# Patient Record
Sex: Male | Born: 1970
Health system: Southern US, Community
[De-identification: ages and names within clinical notes are randomized; demographics above are authoritative.]

## PROBLEM LIST (undated history)

## (undated) HISTORY — PX: HERNIA REPAIR: SHX51

---

## 1998-05-19 ENCOUNTER — Emergency Department (HOSPITAL_COMMUNITY): Admission: EM | Admit: 1998-05-19 | Discharge: 1998-05-19 | Payer: Self-pay | Admitting: Emergency Medicine

## 1998-11-07 ENCOUNTER — Ambulatory Visit (HOSPITAL_COMMUNITY): Admission: RE | Admit: 1998-11-07 | Discharge: 1998-11-07 | Payer: Self-pay | Admitting: Urology

## 1999-09-09 ENCOUNTER — Emergency Department (HOSPITAL_COMMUNITY): Admission: EM | Admit: 1999-09-09 | Discharge: 1999-09-09 | Payer: Self-pay | Admitting: Emergency Medicine

## 2004-02-20 ENCOUNTER — Emergency Department (HOSPITAL_COMMUNITY): Admission: EM | Admit: 2004-02-20 | Discharge: 2004-02-20 | Payer: Self-pay | Admitting: Emergency Medicine

## 2004-05-24 ENCOUNTER — Observation Stay (HOSPITAL_COMMUNITY): Admission: EM | Admit: 2004-05-24 | Discharge: 2004-05-25 | Payer: Self-pay | Admitting: *Deleted

## 2004-07-28 ENCOUNTER — Ambulatory Visit (HOSPITAL_BASED_OUTPATIENT_CLINIC_OR_DEPARTMENT_OTHER): Admission: RE | Admit: 2004-07-28 | Discharge: 2004-07-28 | Payer: Self-pay | Admitting: General Surgery

## 2004-07-28 ENCOUNTER — Ambulatory Visit (HOSPITAL_COMMUNITY): Admission: RE | Admit: 2004-07-28 | Discharge: 2004-07-28 | Payer: Self-pay | Admitting: General Surgery

## 2004-10-02 ENCOUNTER — Ambulatory Visit (HOSPITAL_COMMUNITY): Admission: RE | Admit: 2004-10-02 | Discharge: 2004-10-02 | Payer: Self-pay | Admitting: Family Medicine

## 2005-12-20 IMAGING — CT CT HEAD W/O CM
1 of 2 series · 13 of 30 positions shown, 17 images · non-contrast
Comparison: none

CLINICAL DATA: Altered mental status. Slurred speech.
 CT BRAIN WITHOUT CONTRAST
 Axial scans from the base to the vertex were performed in the unenhanced state.  The ventricular system is normal in size and configuration and the septum is in a normal midline position.  The fourth ventricle and basilar cisterns appear normal.  No acute intracranial abnormality is seen.  No mass effect is noted.  On bone window images, no bony abnormality is seen.  
 IMPRESSION 
 No acute intracranial abnormality.

[Series 2: brain · axial · 0.47mm/px · z∈[+136,+266]mm · 13 of 32 slices shown, 17 images]
[im 3/32  brain]
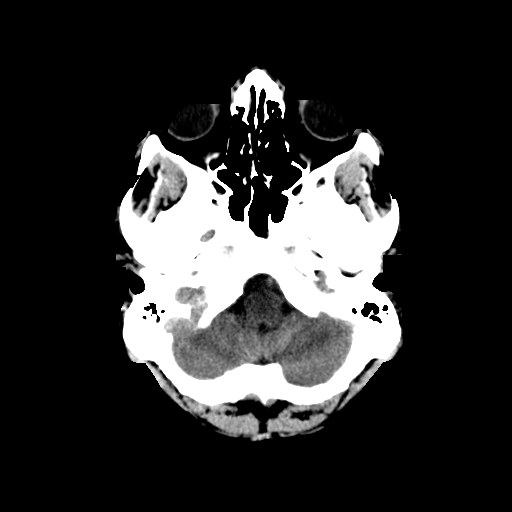
[im 3/32  bone]
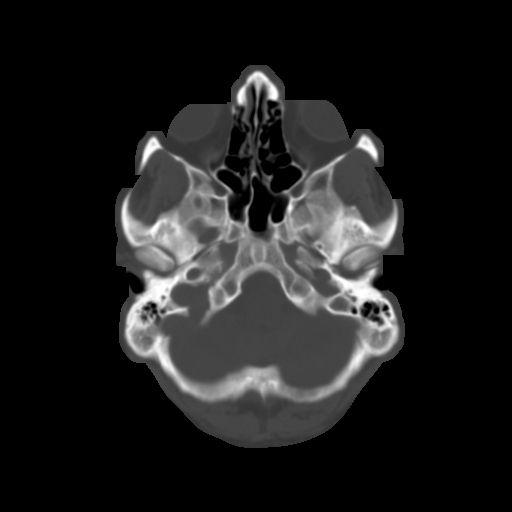
[im 5/32  brain]
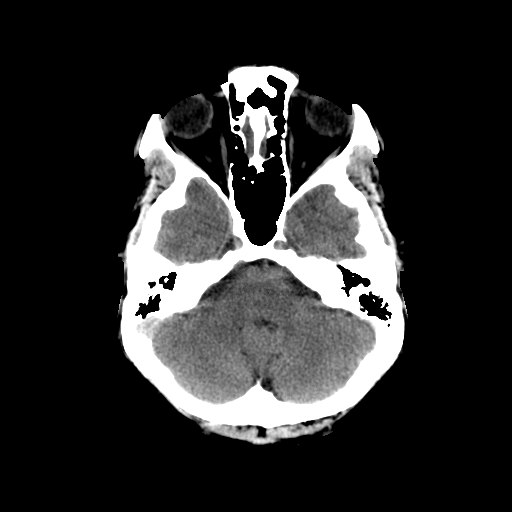
[im 7/32  brain]
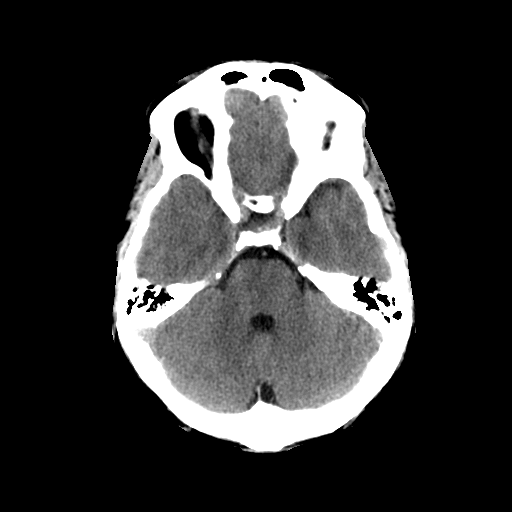
[im 9/32  brain]
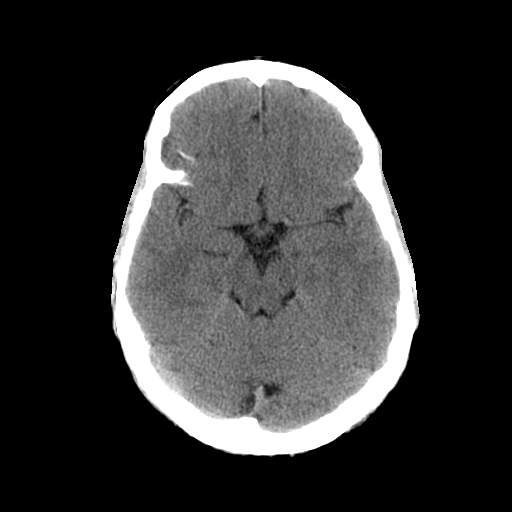
[im 12/32  brain]
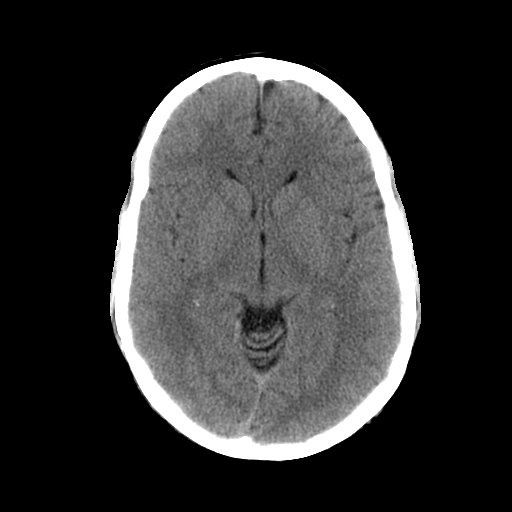
[im 12/32  bone]
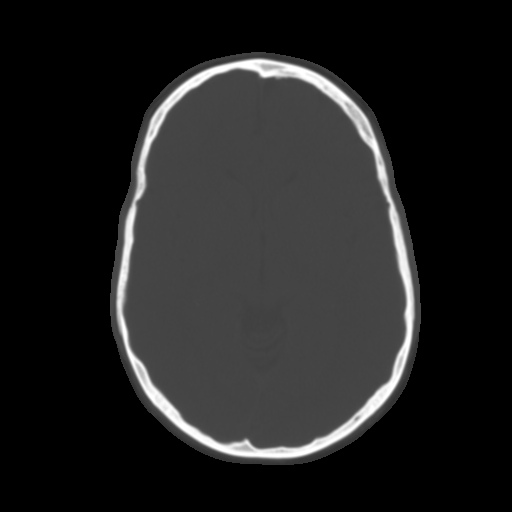
[im 14/32  brain]
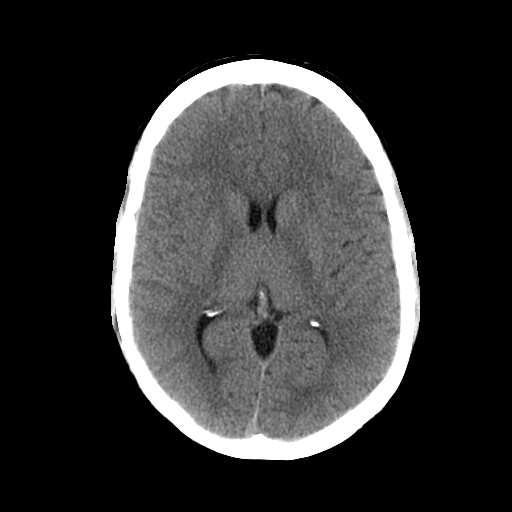
[im 16/32  brain]
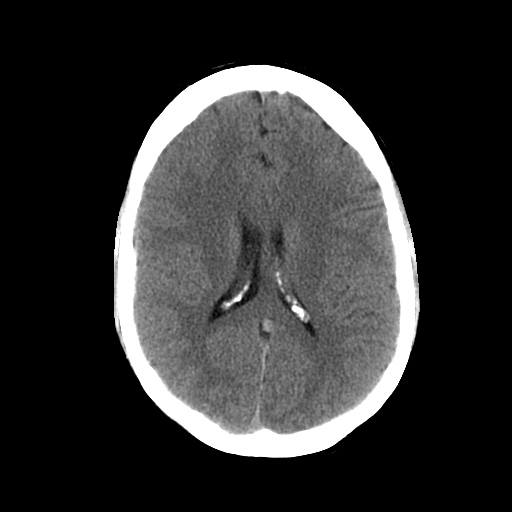
[im 18/32  brain]
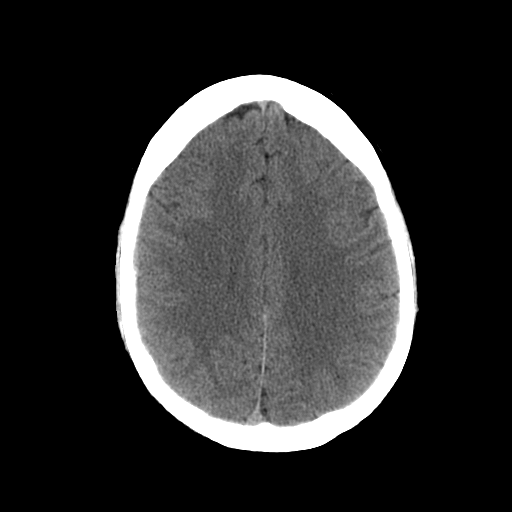
[im 20/32  brain]
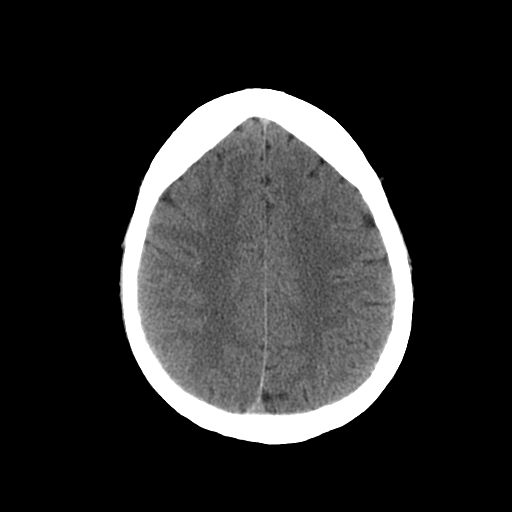
[im 20/32  bone]
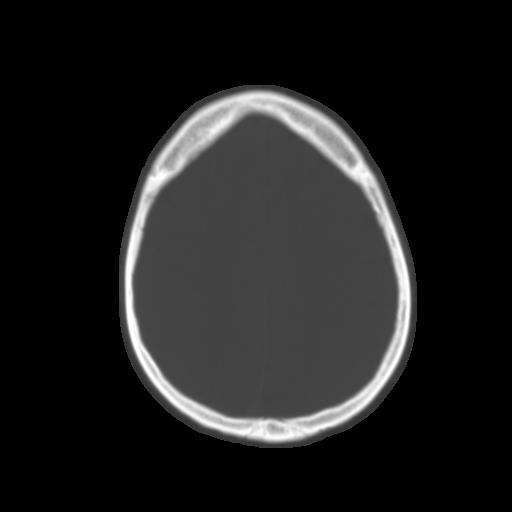
[im 23/32  brain]
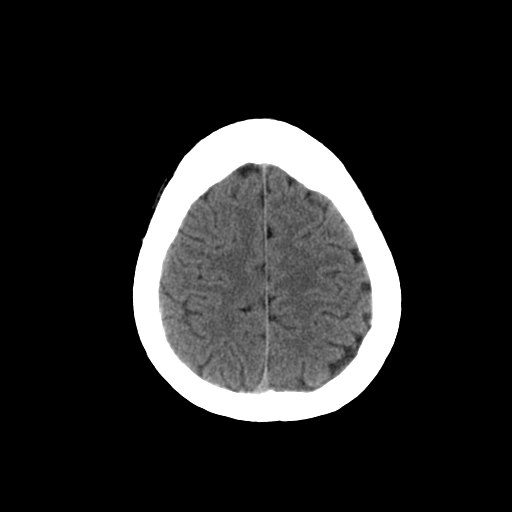
[im 25/32  brain]
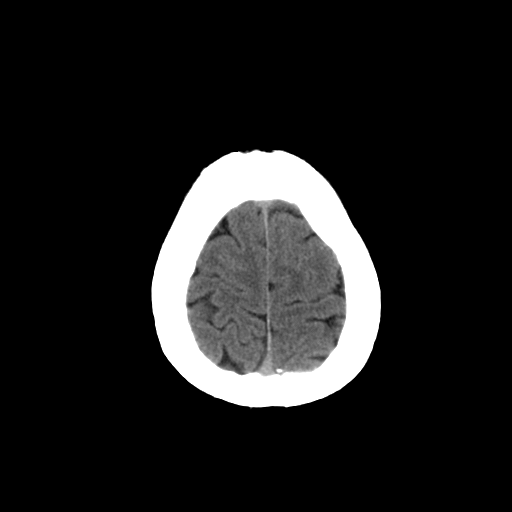
[im 27/32  brain]
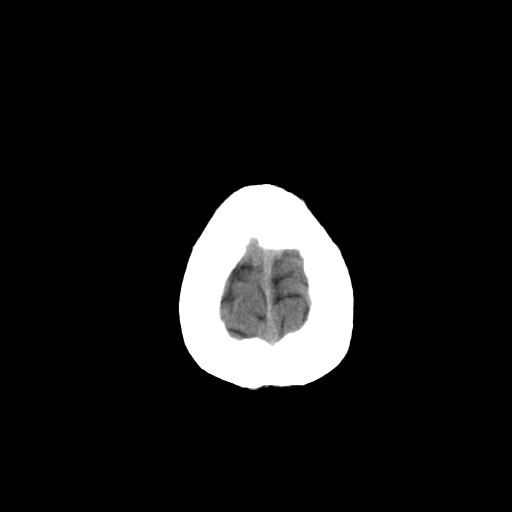
[im 29/32  brain]
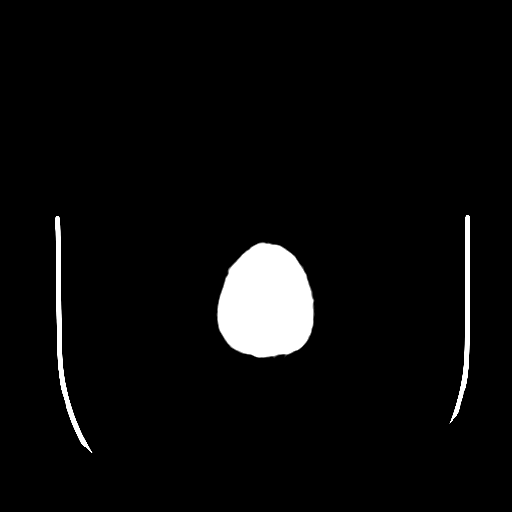
[im 29/32  bone]
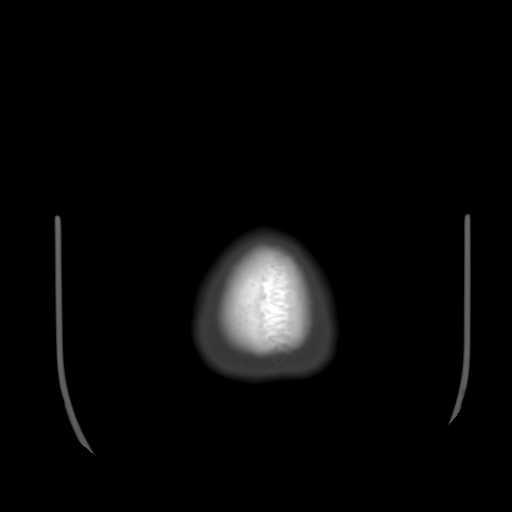

[13 of 30 positions shown; findings below may reference images not displayed]

## 2006-04-30 IMAGING — CR DG CHEST 2V
2 series · 2 of 2 positions shown · non-contrast
Comparison: none

CLINICAL DATA: Chest tightness.  Dyspnea.  Occasional smoker.  
 CHEST - 2 VIEWS: 
 PA and lateral views of the chest are made and are compared to previous studies of 05/24/04.  There is mild diffuse peribronchial thickening, which appears to be stable.  The heart, mediastinum, peripheral lungs, bony thorax and soft tissues are normal.

[view not recorded (1 of 2)]
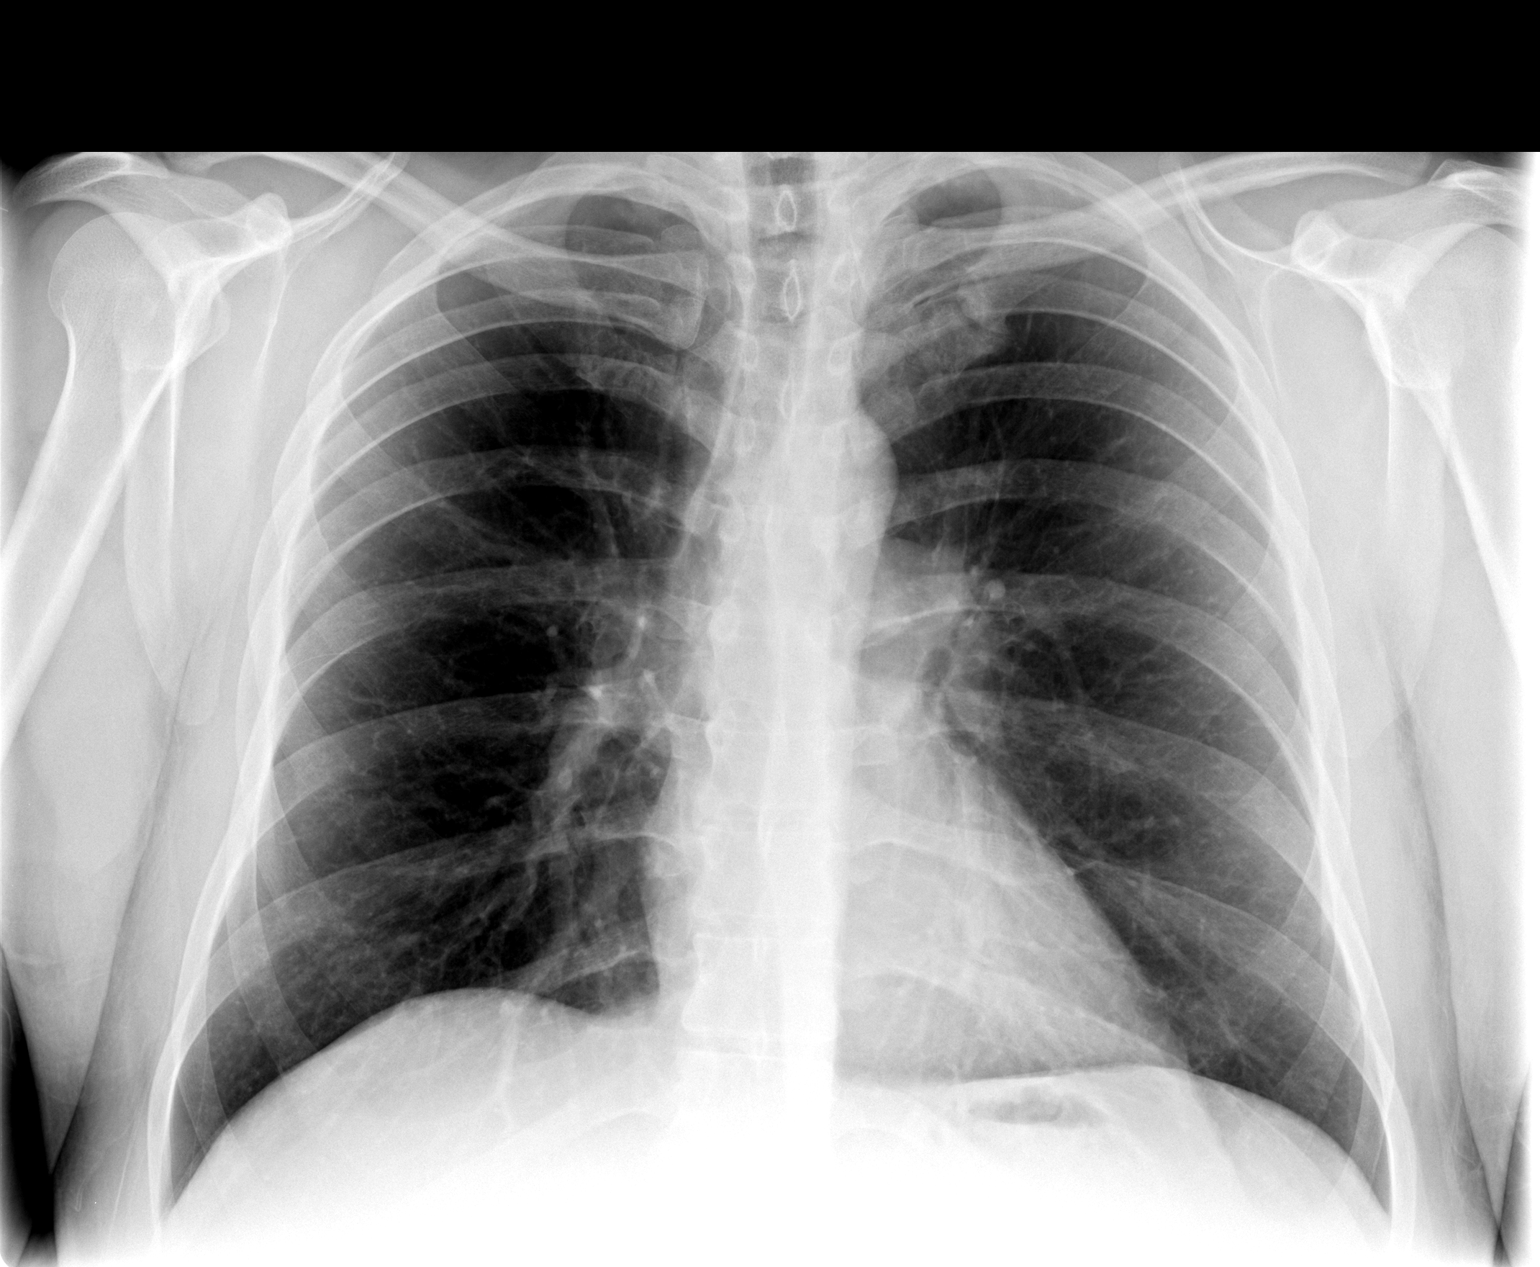

[view not recorded (2 of 2)]
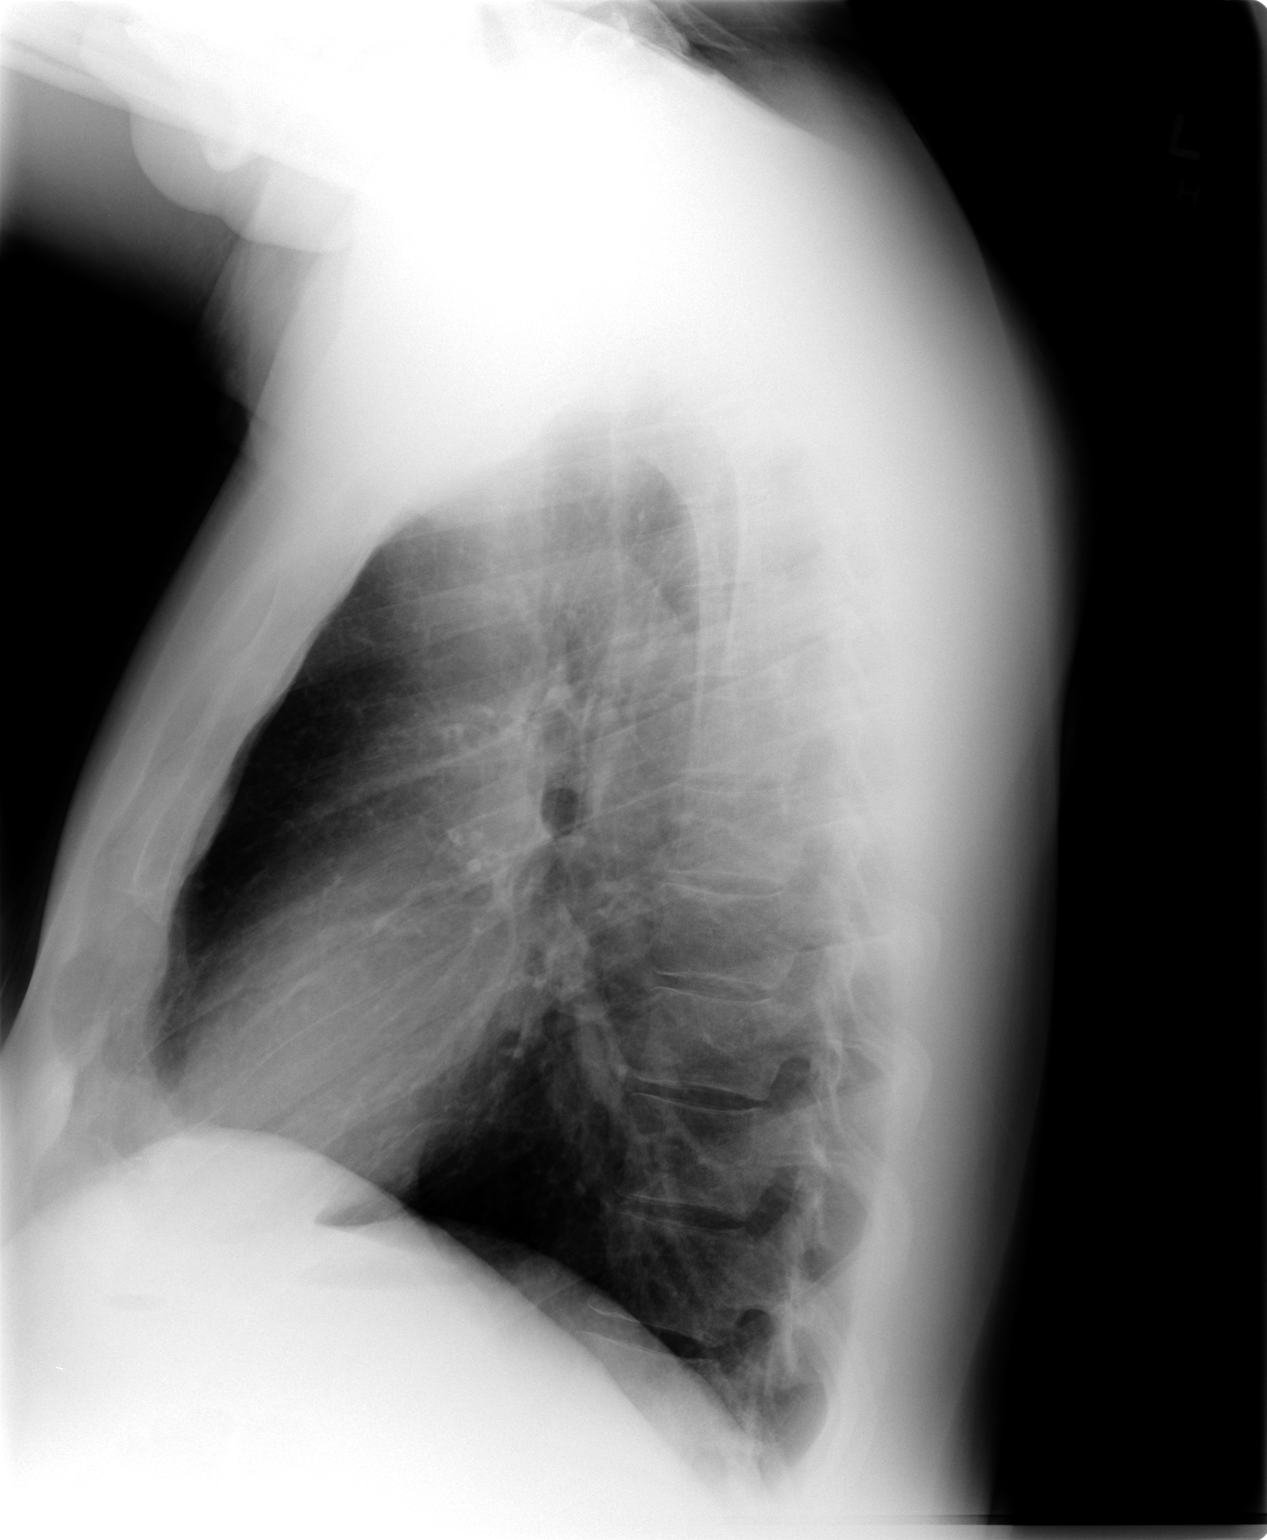

[2 of 2 positions shown; findings below may reference images not displayed]

IMPRESSION: Mild diffuse peribronchial thickening.  No interval change or active disease.

## 2013-11-12 ENCOUNTER — Emergency Department (INDEPENDENT_AMBULATORY_CARE_PROVIDER_SITE_OTHER)
Admission: EM | Admit: 2013-11-12 | Discharge: 2013-11-12 | Disposition: A | Discharge status: Home / Self Care | Payer: No Typology Code available for payment source | Source: Home / Self Care | Attending: Family Medicine | Admitting: Family Medicine

## 2013-11-12 ENCOUNTER — Encounter (HOSPITAL_COMMUNITY): Payer: Self-pay | Admitting: Emergency Medicine

## 2013-11-12 DIAGNOSIS — Y92410 Unspecified street and highway as the place of occurrence of the external cause: Secondary | ICD-10-CM

## 2013-11-12 DIAGNOSIS — M549 Dorsalgia, unspecified: Secondary | ICD-10-CM
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
## 2013-11-13 NOTE — ED Provider Notes (Signed)
Medical screening examination/treatment/procedure(s) were performed by resident physician or non-physician practitioner and as supervising physician I was immediately available for consultation/collaboration.   Keighan Amezcua DOUGLAS MD.   Granvel Proudfoot D Eavan Gonterman, MD 11/13/13 1117 

## 2014-01-25 ENCOUNTER — Emergency Department (INDEPENDENT_AMBULATORY_CARE_PROVIDER_SITE_OTHER)
Admission: EM | Admit: 2014-01-25 | Discharge: 2014-01-25 | Disposition: A | Discharge status: Home / Self Care | Payer: No Typology Code available for payment source | Source: Home / Self Care | Attending: Family Medicine | Admitting: Family Medicine

## 2014-01-25 ENCOUNTER — Encounter (HOSPITAL_COMMUNITY): Payer: Self-pay | Admitting: Emergency Medicine

## 2014-01-25 DIAGNOSIS — M545 Low back pain, unspecified: Secondary | ICD-10-CM

## 2014-01-25 MED ORDER — DICLOFENAC SODIUM 75 MG PO TBEC: 75.0000 mg | DELAYED_RELEASE_TABLET | Freq: Two times a day (BID) | ORAL | Status: DC | PRN

## 2014-01-25 MED ORDER — HYDROCODONE-ACETAMINOPHEN 5-325 MG PO TABS: 1.0000 | ORAL_TABLET | Freq: Four times a day (QID) | ORAL | Status: DC | PRN

## 2014-01-25 MED ORDER — CYCLOBENZAPRINE HCL 10 MG PO TABS: 10.0000 mg | ORAL_TABLET | Freq: Every evening | ORAL | Status: DC | PRN

## 2014-01-25 NOTE — ED Notes (Signed)
Patient c/o back spasms since yesterday morning. Patient states when he raises his legs the pain is more intense.  Patient has tired Max Freeze and naproxen with some relief.

## 2014-01-25 NOTE — Discharge Instructions (Signed)
Thank you for coming in today. °Come back or go to the emergency room if you notice new weakness new numbness problems walking or bowel or bladder problems. ° ° °Back Exercises °These exercises may help you when beginning to rehabilitate your injury. Your symptoms may resolve with or without further involvement from your physician, physical therapist or athletic trainer. While completing these exercises, remember:  °· Restoring tissue flexibility helps normal motion to return to the joints. This allows healthier, less painful movement and activity. °· An effective stretch should be held for at least 30 seconds. °· A stretch should never be painful. You should only feel a gentle lengthening or release in the stretched tissue. °STRETCH - Extension, Prone on Elbows  °· Lie on your stomach on the floor, a bed will be too soft. Place your palms about shoulder width apart and at the height of your head. °· Place your elbows under your shoulders. If this is too painful, stack pillows under your chest. °· Allow your body to relax so that your hips drop lower and make contact more completely with the floor. °· Hold this position for __________ seconds. °· Slowly return to lying flat on the floor. °Repeat __________ times. Complete this exercise __________ times per day.  °RANGE OF MOTION - Extension, Prone Press Ups  °· Lie on your stomach on the floor, a bed will be too soft. Place your palms about shoulder width apart and at the height of your head. °· Keeping your back as relaxed as possible, slowly straighten your elbows while keeping your hips on the floor. You may adjust the placement of your hands to maximize your comfort. As you gain motion, your hands will come more underneath your shoulders. °· Hold this position __________ seconds. °· Slowly return to lying flat on the floor. °Repeat __________ times. Complete this exercise __________ times per day.  °RANGE OF MOTION- Quadruped, Neutral Spine  °· Assume a hands  and knees position on a firm surface. Keep your hands under your shoulders and your knees under your hips. You may place padding under your knees for comfort. °· Drop your head and point your tail bone toward the ground below you. This will round out your low back like an angry cat. Hold this position for __________ seconds. °· Slowly lift your head and release your tail bone so that your back sags into a large arch, like an old horse. °· Hold this position for __________ seconds. °· Repeat this until you feel limber in your low back. °· Now, find your "sweet spot." This will be the most comfortable position somewhere between the two previous positions. This is your neutral spine. Once you have found this position, tense your stomach muscles to support your low back. °· Hold this position for __________ seconds. °Repeat __________ times. Complete this exercise __________ times per day.  °STRETCH - Flexion, Single Knee to Chest  °· Lie on a firm bed or floor with both legs extended in front of you. °· Keeping one leg in contact with the floor, bring your opposite knee to your chest. Hold your leg in place by either grabbing behind your thigh or at your knee. °· Pull until you feel a gentle stretch in your low back. Hold __________ seconds. °· Slowly release your grasp and repeat the exercise with the opposite side. °Repeat __________ times. Complete this exercise __________ times per day.  °STRETCH - Hamstrings, Standing °· Stand or sit and extend your right / left   leg, placing your foot on a chair or foot stool °· Keeping a slight arch in your low back and your hips straight forward. °· Lead with your chest and lean forward at the waist until you feel a gentle stretch in the back of your right / left knee or thigh. (When done correctly, this exercise requires leaning only a small distance.) °· Hold this position for __________ seconds. °Repeat __________ times. Complete this stretch __________ times per  day. °STRENGTHENING - Deep Abdominals, Pelvic Tilt  °· Lie on a firm bed or floor. Keeping your legs in front of you, bend your knees so they are both pointed toward the ceiling and your feet are flat on the floor. °· Tense your lower abdominal muscles to press your low back into the floor. This motion will rotate your pelvis so that your tail bone is scooping upwards rather than pointing at your feet or into the floor. °· With a gentle tension and even breathing, hold this position for __________ seconds. °Repeat __________ times. Complete this exercise __________ times per day.  °STRENGTHENING - Abdominals, Crunches  °· Lie on a firm bed or floor. Keeping your legs in front of you, bend your knees so they are both pointed toward the ceiling and your feet are flat on the floor. Cross your arms over your chest. °· Slightly tip your chin down without bending your neck. °· Tense your abdominals and slowly lift your trunk high enough to just clear your shoulder blades. Lifting higher can put excessive stress on the low back and does not further strengthen your abdominal muscles. °· Control your return to the starting position. °Repeat __________ times. Complete this exercise __________ times per day.  °STRENGTHENING - Quadruped, Opposite UE/LE Lift  °· Assume a hands and knees position on a firm surface. Keep your hands under your shoulders and your knees under your hips. You may place padding under your knees for comfort. °· Find your neutral spine and gently tense your abdominal muscles so that you can maintain this position. Your shoulders and hips should form a rectangle that is parallel with the floor and is not twisted. °· Keeping your trunk steady, lift your right hand no higher than your shoulder and then your left leg no higher than your hip. Make sure you are not holding your breath. Hold this position __________ seconds. °· Continuing to keep your abdominal muscles tense and your back steady, slowly return  to your starting position. Repeat with the opposite arm and leg. °Repeat __________ times. Complete this exercise __________ times per day. °Document Released: 08/06/2005 Document Revised: 10/11/2011 Document Reviewed: 10/31/2008 °ExitCare® Patient Information ©2015 [employer]. This information is not intended to replace advice given to you by your health care provider. Make sure you discuss any questions you have with your health care provider. ° °

## 2014-01-25 NOTE — ED Provider Notes (Signed)
Scott HoveChristopher Peoples Sr. is a 43 y.o. male who presents to Urgent Care today for low back pain. Patient has acute onset of bilateral low back pain starting yesterday morning. He denies any injury. The pain occurred when he stood up from a bent forward position. He denies any radiating pain weakness or numbness. He denies any fevers chills nausea vomiting or diarrhea. No bowel bladder dysfunction. He tried an old leftover prescription of Norco which helped his pain some.   History reviewed. No pertinent past medical history. History  Substance Use Topics  . Smoking status: Current Every Day Smoker -- 0.50 packs/day    Types: Cigarettes  . Smokeless tobacco: Not on file  . Alcohol Use: Yes   ROS as above Medications: No current facility-administered medications for this encounter.   Current Outpatient Prescriptions  Medication Sig Dispense Refill  . ALLOPURINOL PO Take by mouth.      . Fish Oil-Cholecalciferol (FISH OIL + D3 PO) Take by mouth.      Scott Beard Kitchen. gemfibrozil (LOPID) 600 MG tablet Take 600 mg by mouth 2 (two) times daily before a meal.      . magnesium 30 MG tablet Take 30 mg by mouth 2 (two) times daily.      . Multiple Vitamins-Minerals (MULTIVITAMIN WITH MINERALS) tablet Take 1 tablet by mouth daily.      . potassium chloride (KLOR-CON) 20 MEQ packet Take 20 mEq by mouth 2 (two) times daily.      . cyclobenzaprine (FLEXERIL) 10 MG tablet Take 1 tablet (10 mg total) by mouth at bedtime as needed for muscle spasms.  20 tablet  0  . diclofenac (VOLTAREN) 75 MG EC tablet Take 1 tablet (75 mg total) by mouth 2 (two) times daily as needed.  60 tablet  0  . HYDROcodone-acetaminophen (NORCO/VICODIN) 5-325 MG per tablet Take 1 tablet by mouth every 6 (six) hours as needed.  15 tablet  0    Exam:  BP 123/71  Pulse 66  Temp(Src) 97.8 F (36.6 C) (Oral)  Resp 12  SpO2 98% Gen: Well NAD HEENT: ,  MMM Lungs: Normal work of breathing. CTABL Heart: RRR no MRG Exts: Brisk capillary refill,  warm and well perfused.  Back: Nontender to spinal midline. Tender palpation bilateral SI joint. Lumbar range of motion is significantly limited by pain. Lower extremity strength is intact as is reflexes and sensation. Equal bilaterally.  No results found for this or any previous visit (from the past 24 hour(s)). No results found.  Assessment and Plan: 43 y.o. male with lumbago due to myofascial disruption. Plan to treat with diclofenac Flexeril and Norco. Home exercise program. Followup with orthopedics as needed.  Discussed warning signs or symptoms. Please see discharge instructions. Patient expresses understanding.    Rodolph BongEvan S Myya Meenach, MD 01/25/14 1740

## 2014-12-19 ENCOUNTER — Other Ambulatory Visit: Payer: Self-pay | Admitting: *Deleted

## 2014-12-19 DIAGNOSIS — R202 Paresthesia of skin: Secondary | ICD-10-CM

## 2015-01-07 ENCOUNTER — Encounter: Payer: No Typology Code available for payment source | Admitting: Neurology

## 2015-01-08 ENCOUNTER — Encounter: Payer: Self-pay | Admitting: Neurology

## 2015-08-19 ENCOUNTER — Ambulatory Visit (INDEPENDENT_AMBULATORY_CARE_PROVIDER_SITE_OTHER): Payer: 59 | Admitting: Neurology

## 2015-08-19 DIAGNOSIS — R202 Paresthesia of skin: Secondary | ICD-10-CM

## 2015-08-19 DIAGNOSIS — G5601 Carpal tunnel syndrome, right upper limb: Secondary | ICD-10-CM

## 2015-08-19 NOTE — Procedures (Signed)
[facility]  [address] [place], [address]  [place], [place] [address] Tel: [phone] Fax:  [phone] Test Date:  08/19/2015  Patient: Scott Beard, Scott Beard DOB: 1971/03/15 Physician: Nita Sickle, DO  Sex: Male Height:  Ref Phys: Tally Joe  ID#: [id number] Temp: 36.3C Technician:    Patient Complaints: This is a 45 year-old gentleman referred for right hand paresthesias.  NCV & EMG Findings: Extensive electrodiagnostic testing of the right upper extremity shows: 1. Right median sensory response shows prolonged distal peak latency (5.0 ms) and reduced amplitude (10.4 V).  Right ulnar sensory response is within normal limits. 2. Right median motor nerve showed prolonged distal onset latency (4.5 ms).  The right ulnar motor nerve showed decreased conduction velocity (A Elbow-B Elbow, 48 m/s).   3. There is no evidence of active or chronic motor axon loss changes affecting any of the tested muscles. Motor unit recruitment pattern and configuration is within normal limits.  Impression: 1. Right median neuropathy at or distal to the wrist, consistent with the clinical diagnosis of carpal tunnel syndrome. Overall, these findings are moderate in degree electrically. 2. Very mild right ulnar neuropathy with slowing across the elbow, purely demyelinating in type. 3. There is no evidence of a cervical radiculopathy.   ___________________________ Nita Sickle, DO    Nerve Conduction Studies Anti Sensory Summary Table   Site NR Peak (ms) Norm Peak (ms) P-T Amp (V) Norm P-T Amp  Right Median Anti Sensory (2nd Digit)  Wrist    5.0 <3.4 10.4 >20  Right Ulnar Anti Sensory (5th Digit)  Wrist    2.6 <3.1 22.3 >12   Motor Summary Table   Site NR Onset (ms) Norm Onset (ms) O-P Amp (mV) Norm O-P Amp Site1 Site2 Delta-0 (ms) Dist (cm) Vel (m/s) Norm Vel (m/s)  Right Median Motor (Abd Poll Brev)  Wrist    4.5 <3.9 7.0 >6 Elbow Wrist 5.5 31.0 56 >50  Elbow    10.0  6.9          Right Ulnar Motor (Abd Dig Minimi)  Wrist    1.9 <3.1 9.3 >7 B Elbow Wrist 3.8 23.0 61 >50  B Elbow    5.7  8.0  A Elbow B Elbow 2.1 10.0 48 >50  A Elbow    7.8  7.4          EMG   Side Muscle Ins Act Fibs Psw Fasc Number Recrt Dur Dur. Amp Amp. Poly Poly. Comment  Right 1stDorInt Nml Nml Nml Nml Nml Nml Nml Nml Nml Nml Nml Nml N/A  Right Abd Poll Brev Nml Nml Nml Nml Nml Nml Nml Nml Nml Nml Nml Nml N/A  Right Ext Indicis Nml Nml Nml Nml Nml Nml Nml Nml Nml Nml Nml Nml N/A  Right PronatorTeres Nml Nml Nml Nml Nml Nml Nml Nml Nml Nml Nml Nml N/A  Right Biceps Nml Nml Nml Nml Nml Nml Nml Nml Nml Nml Nml Nml N/A  Right Triceps Nml Nml Nml Nml Nml Nml Nml Nml Nml Nml Nml Nml N/A  Right FlexorDigProf 4&5 Nml Nml Nml Nml Nml Nml Nml Nml Nml Nml Nml Nml N/A     Waveforms:

## 2017-09-11 ENCOUNTER — Encounter (HOSPITAL_COMMUNITY): Payer: Self-pay | Admitting: *Deleted

## 2017-09-11 ENCOUNTER — Ambulatory Visit (HOSPITAL_COMMUNITY)
Admission: EM | Admit: 2017-09-11 | Discharge: 2017-09-11 | Disposition: A | Discharge status: Home / Self Care | Payer: Managed Care, Other (non HMO) | Attending: Physician Assistant | Admitting: Physician Assistant

## 2017-09-11 ENCOUNTER — Other Ambulatory Visit: Payer: Self-pay

## 2017-09-11 DIAGNOSIS — M545 Low back pain: Secondary | ICD-10-CM | POA: Diagnosis not present

## 2017-09-11 MED ORDER — CYCLOBENZAPRINE HCL 10 MG PO TABS: 10.0000 mg | ORAL_TABLET | Freq: Every evening | ORAL | 0 refills | Status: DC | PRN

## 2017-09-11 MED ORDER — CYCLOBENZAPRINE HCL 10 MG PO TABS: 10.0000 mg | ORAL_TABLET | Freq: Three times a day (TID) | ORAL | 0 refills | Status: DC | PRN

## 2017-09-11 MED ORDER — MELOXICAM 15 MG PO TABS: 15.0000 mg | ORAL_TABLET | Freq: Every day | ORAL | 0 refills | Status: AC

## 2017-09-11 MED ORDER — KETOROLAC TROMETHAMINE 60 MG/2ML IM SOLN
INTRAMUSCULAR | Status: AC
  Filled 2017-09-11: qty 2

## 2017-09-11 MED ORDER — KETOROLAC TROMETHAMINE 60 MG/2ML IM SOLN
60.0000 mg | Freq: Once | INTRAMUSCULAR | Status: AC
  Administered 2017-09-11: 60 mg via INTRAMUSCULAR

## 2017-09-11 NOTE — Discharge Instructions (Signed)
Please take Tylenol 1000 mg every 8 hours for back pain along with the Flexeril and the meloxicam.  If your not getting better in 3 weeks please come back for imaging.  Do not take any other NSAIDs i.e. ibuprofen Aleve aspirin along with this regimen.

## 2017-09-11 NOTE — ED Provider Notes (Signed)
09/11/2017 4:33 PM   DOB: Mar 18, 1971 / MRN: [id number]  SUBJECTIVE:  Scott Hovehristopher Zucker Sr. is a 47 y.o. male presenting for low back pain that started yesterday.  States the pain is excruciating and worse with movement.  Denies dysuria, hematuria, constipation, incontinence.  No trauma.  Denies weakness and numbness in the lower extremities. He has No Known Allergies.   He  has no past medical history on file.    He  reports that he has been smoking cigarettes.  He has been smoking about 0.50 packs per day. he has never used smokeless tobacco. He reports that he drinks alcohol. He reports that he does not use drugs. He  reports that he currently engages in sexual activity. The patient  has a past surgical history that includes Hernia repair.  His family history is not on file.  Review of Systems  Constitutional: Negative for chills, diaphoresis and fever.  Respiratory: Negative for shortness of breath.   Cardiovascular: Negative for chest pain, orthopnea and leg swelling.  Gastrointestinal: Negative for nausea.  Musculoskeletal: Positive for back pain and myalgias. Negative for falls, joint pain and neck pain.  Skin: Negative for rash.  Neurological: Negative for dizziness.    OBJECTIVE:  BP 119/63 (BP Location: Left Arm)   Pulse 80   Temp 98.3 F (36.8 C) (Oral)   SpO2 98%   Physical Exam  Constitutional: He appears well-developed. He is active and cooperative.  Non-toxic appearance.  Cardiovascular: Normal rate.  Pulmonary/Chest: Effort normal. No tachypnea.  Neurological: He is alert. He has normal strength and normal reflexes. He displays no atrophy. No cranial nerve deficit or sensory deficit. He exhibits normal muscle tone. Gait ( Gait antalgic) abnormal. Coordination normal. GCS eye subscore is 4. GCS verbal subscore is 5. GCS motor subscore is 6.  Skin: Skin is warm and dry. He is not diaphoretic. No pallor.  Vitals reviewed.   No results found for this or any previous  visit (from the past 72 hour(s)).  No results found.  ASSESSMENT AND PLAN:  No orders of the defined types were placed in this encounter.    Acute low back pain, unspecified back pain laterality, with sciatica presence unspecified: Exam reassuring.  He appears to be quite a bit of pain.  I have given him a shot of Toradol here along with meloxicam, Flexeril and am advising Tylenol 1000 mg scheduled every 8 hours.  He will come back in 2-3 weeks if he is not getting better for imaging.      The patient is advised to call or return to clinic if he does not see an improvement in symptoms, or to seek the care of the closest emergency department if he worsens with the above plan.   Deliah BostonMichael Beard, MHS, Beard 09/11/2017 4:33 PM    [name], Scott Beard 09/11/17 [phone]

## 2017-09-11 NOTE — ED Triage Notes (Addendum)
Back pain, per pt he dont know what happened

## 2017-10-08 ENCOUNTER — Other Ambulatory Visit: Payer: Self-pay | Admitting: Physician Assistant

## 2018-09-15 ENCOUNTER — Other Ambulatory Visit: Payer: Self-pay | Admitting: Family Medicine

## 2018-09-15 ENCOUNTER — Ambulatory Visit
Admission: RE | Admit: 2018-09-15 | Discharge: 2018-09-15 | Disposition: A | Discharge status: Home / Self Care | Payer: Managed Care, Other (non HMO) | Source: Ambulatory Visit | Attending: Family Medicine | Admitting: Family Medicine

## 2018-09-15 DIAGNOSIS — M25551 Pain in right hip: Secondary | ICD-10-CM

## 2020-04-10 ENCOUNTER — Ambulatory Visit (HOSPITAL_COMMUNITY)
Admission: EM | Admit: 2020-04-10 | Discharge: 2020-04-10 | Disposition: A | Discharge status: Home / Self Care | Payer: BLUE CROSS/BLUE SHIELD | Attending: Family Medicine | Admitting: Family Medicine

## 2020-04-10 ENCOUNTER — Other Ambulatory Visit: Payer: Self-pay

## 2020-04-10 ENCOUNTER — Encounter (HOSPITAL_COMMUNITY): Payer: Self-pay | Admitting: Emergency Medicine

## 2020-04-10 DIAGNOSIS — B349 Viral infection, unspecified: Secondary | ICD-10-CM | POA: Insufficient documentation

## 2020-04-10 DIAGNOSIS — Z20822 Contact with and (suspected) exposure to covid-19: Secondary | ICD-10-CM | POA: Insufficient documentation

## 2020-04-10 DIAGNOSIS — R509 Fever, unspecified: Secondary | ICD-10-CM | POA: Diagnosis present

## 2020-04-10 NOTE — Discharge Instructions (Signed)
Recommendation management of viral illness include: Vitamin D 5,000 IU daily Vitamin C 500 mg twice daily Zinc 50 mg daily

## 2020-04-10 NOTE — ED Provider Notes (Signed)
[facility]    CSN: [id number] Arrival date & time: 04/10/20  1806      History   Chief Complaint Chief Complaint  Patient presents with  . Cough  . Fever    HPI Yoshiaki Kreuser Sr. is a 49 y.o. male.   HPI  Encounter for COVID-19 Testing in Symptomatic Patient Recent Exposure to persons positive for COVID-19: Yes , close contact within the last 7 days with a person confirmed to have COVID-19  Experienced Fever:Yes, x 2 days TMA 102.1  Current Symptoms: Symptoms Body aches, fatigue and congestions, and headache Vaccination status: unvaccinated    History reviewed. No pertinent past medical history.  There are no problems to display for this patient.   Past Surgical History:  Procedure Laterality Date  . HERNIA REPAIR         Home Medications    Prior to Admission medications   Medication Sig Start Date End Date Taking? Authorizing Provider  ALLOPURINOL PO Take by mouth.   Yes [provider]  calcium-vitamin D (OSCAL WITH D) 500-200 MG-UNIT tablet Take 1 tablet by mouth.   Yes [provider]  gemfibrozil (LOPID) 600 MG tablet Take 600 mg by mouth 2 (two) times daily before a meal.   Yes [provider]  Multiple Vitamins-Minerals (MULTIVITAMIN WITH MINERALS) tablet Take 1 tablet by mouth daily.   Yes [provider]  cyclobenzaprine (FLEXERIL) 10 MG tablet Take 1 tablet (10 mg total) by mouth at bedtime as needed for muscle spasms. 09/11/17   Ofilia Neas, PA-C  cyclobenzaprine (FLEXERIL) 10 MG tablet Take 1 tablet (10 mg total) by mouth 3 (three) times daily as needed for muscle spasms. 09/11/17   Ofilia Neas, PA-C  Fish Oil-Cholecalciferol (FISH OIL + D3 PO) Take by mouth.    [provider]  magnesium 30 MG tablet Take 30 mg by mouth 2 (two) times daily.    [provider]  potassium chloride (KLOR-CON) 20 MEQ packet Take 20 mEq by mouth 2 (two) times daily.    [provider]      Family History Family History  Family history unknown: Yes    Social History Social History   Tobacco Use  . Smoking status: Current Every Day Smoker    Packs/day: 0.50    Types: Cigarettes  . Smokeless tobacco: Never Used  Vaping Use  . Vaping Use: Never used  Substance Use Topics  . Alcohol use: Yes  . Drug use: No     Allergies   Patient has no known allergies.   Review of Systems Review of Systems Pertinent negatives listed in HPI Physical Exam Triage Vital Signs ED Triage Vitals  Enc Vitals Group     BP 04/10/20 2024 128/83     Pulse Rate 04/10/20 2024 (Abnormal) 115     Resp 04/10/20 2024 16     Temp 04/10/20 2024 (Abnormal) 102.1 F (38.9 C)     Temp Source 04/10/20 2024 Oral     SpO2 04/10/20 2024 98 %     Weight --      Height --      Head Circumference --      Peak Flow --      Pain Score 04/10/20 2020 5     Pain Loc --      Pain Edu? --      Excl. in GC? --    No data found.  Updated Vital Signs Blood Pressure 128/83 (BP  Location: Right Arm)   Pulse (Abnormal) 115   Temperature (Abnormal) 102.1 F (38.9 C) (Oral)   Respiration 16   Oxygen Saturation 98%   Visual Acuity Right Eye Distance:   Left Eye Distance:   Bilateral Distance:    Right Eye Near:   Left Eye Near:    Bilateral Near:     Physical Exam General appearance: alert, ill-appearing, cooperative Respiratory: Respirations even and unlabored, normal respiratory rate, no crackles or rales. Heart: Increased HR and rhythm normal. No gallop or murmurs noted on exam  Abdomen: BS +, no distention, no rebound tenderness, or no mass Extremities: No gross deformities Skin: Skin color, texture, turgor normal. No rashes seen  Psych: Appropriate mood and affect. Neurologic: Mental status: Alert, oriented to person, place, and time, thought content appropriate.  UC Treatments / Results  Labs (all labs ordered are listed, but only abnormal results are displayed) Labs Reviewed  - No data to display  EKG   Radiology No results found.  Procedures Procedures (including critical care time)  Medications Ordered in UC Medications - No data to display  Initial Impression / Assessment and Plan / UC Course  I have reviewed the triage vital signs and the nursing notes.  Pertinent labs & imaging results that were available during my care of the patient were reviewed by me and considered in my medical decision making (see chart for details).     Symptoms having suspicion for COVID-19 viral infection given fever and associated symptoms and recent exposure.  Covid test pending within the next 24 to 48 hours.  Work note provided.  Quarantine for 10 days if test is positive.  Continue quarantine while awaiting results.  Red flags discussed that warrant immediate evaluation in the ER.  Continue with hydration to prevent dehydration improved heart rate which is likely due to fever.  Final Clinical Impressions(s) / UC Diagnoses   Final diagnoses:  Suspected COVID-19 virus infection  Fever, unspecified  Viral illness     Discharge Instructions     Recommendation management of viral illness include: Vitamin D 5,000 IU daily Vitamin C 500 mg twice daily Zinc 50 mg daily     ED Prescriptions    None     PDMP not reviewed this encounter.   Bing Neighbors, FNP 04/15/20 1536

## 2020-04-10 NOTE — ED Triage Notes (Signed)
Pt c/o dry cough x 1 week. Pt states 2 days ago he started having a fever. He also has intermittent lightheadness. Pt states that he has had covid exposure last weekend.

## 2020-04-11 LAB — SARS CORONAVIRUS 2 (TAT 6-24 HRS): SARS Coronavirus 2: POSITIVE — AB

## 2022-08-15 ENCOUNTER — Emergency Department (HOSPITAL_COMMUNITY): Payer: BLUE CROSS/BLUE SHIELD

## 2022-08-15 ENCOUNTER — Other Ambulatory Visit: Payer: Self-pay

## 2022-08-15 ENCOUNTER — Emergency Department (HOSPITAL_COMMUNITY)
Admission: EM | Admit: 2022-08-15 | Discharge: 2022-08-15 | Disposition: A | Discharge status: Home / Self Care | Payer: BLUE CROSS/BLUE SHIELD | Attending: Emergency Medicine | Admitting: Emergency Medicine

## 2022-08-15 DIAGNOSIS — S301XXA Contusion of abdominal wall, initial encounter: Secondary | ICD-10-CM | POA: Insufficient documentation

## 2022-08-15 DIAGNOSIS — Z23 Encounter for immunization: Secondary | ICD-10-CM | POA: Insufficient documentation

## 2022-08-15 DIAGNOSIS — S0990XA Unspecified injury of head, initial encounter: Secondary | ICD-10-CM | POA: Insufficient documentation

## 2022-08-15 DIAGNOSIS — S3669XA Other injury of rectum, initial encounter: Secondary | ICD-10-CM | POA: Insufficient documentation

## 2022-08-15 DIAGNOSIS — S63602A Unspecified sprain of left thumb, initial encounter: Secondary | ICD-10-CM | POA: Diagnosis not present

## 2022-08-15 DIAGNOSIS — Y92410 Unspecified street and highway as the place of occurrence of the external cause: Secondary | ICD-10-CM | POA: Insufficient documentation

## 2022-08-15 DIAGNOSIS — S20229A Contusion of unspecified back wall of thorax, initial encounter: Secondary | ICD-10-CM | POA: Diagnosis not present

## 2022-08-15 DIAGNOSIS — S60932A Unspecified superficial injury of left thumb, initial encounter: Secondary | ICD-10-CM | POA: Diagnosis present

## 2022-08-15 LAB — CBC
HCT: 41.3 % (ref 39.0–52.0)
Hemoglobin: 14.3 g/dL (ref 13.0–17.0)
MCH: 32.8 pg (ref 26.0–34.0)
MCHC: 34.6 g/dL (ref 30.0–36.0)
MCV: 94.7 fL (ref 80.0–100.0)
Platelets: 240 10*3/uL (ref 150–400)
RBC: 4.36 MIL/uL (ref 4.22–5.81)
RDW: 13.1 % (ref 11.5–15.5)
WBC: 5.9 10*3/uL (ref 4.0–10.5)
nRBC: 0 % (ref 0.0–0.2)

## 2022-08-15 LAB — LACTIC ACID, PLASMA: Lactic Acid, Venous: 2 mmol/L (ref 0.5–1.9)

## 2022-08-15 LAB — COMPREHENSIVE METABOLIC PANEL
ALT: 32 U/L (ref 0–44)
AST: 36 U/L (ref 15–41)
Albumin: 3.8 g/dL (ref 3.5–5.0)
Alkaline Phosphatase: 47 U/L (ref 38–126)
Anion gap: 8 (ref 5–15)
BUN: 11 mg/dL (ref 6–20)
CO2: 23 mmol/L (ref 22–32)
Calcium: 8.5 mg/dL — ABNORMAL LOW (ref 8.9–10.3)
Chloride: 106 mmol/L (ref 98–111)
Creatinine, Ser: 0.9 mg/dL (ref 0.61–1.24)
GFR, Estimated: >60 mL/min (ref >60)
Glucose, Bld: 153 mg/dL — ABNORMAL HIGH (ref 70–99)
Potassium: 3.6 mmol/L (ref 3.5–5.1)
Sodium: 137 mmol/L (ref 135–145)
Total Bilirubin: 0.5 mg/dL (ref 0.3–1.2)
Total Protein: 6.6 g/dL (ref 6.5–8.1)

## 2022-08-15 LAB — I-STAT CHEM 8, ED
BUN: 14 mg/dL (ref 6–20)
Calcium, Ion: 1.1 mmol/L — ABNORMAL LOW (ref 1.15–1.40)
Chloride: 104 mmol/L (ref 98–111)
Creatinine, Ser: 0.8 mg/dL (ref 0.61–1.24)
Glucose, Bld: 153 mg/dL — ABNORMAL HIGH (ref 70–99)
HCT: 41 % (ref 39.0–52.0)
Hemoglobin: 13.9 g/dL (ref 13.0–17.0)
Potassium: 4 mmol/L (ref 3.5–5.1)
Sodium: 142 mmol/L (ref 135–145)
TCO2: 24 mmol/L (ref 22–32)

## 2022-08-15 LAB — PROTIME-INR
INR: 1 (ref 0.8–1.2)
Prothrombin Time: 12.9 seconds (ref 11.4–15.2)

## 2022-08-15 LAB — SAMPLE TO BLOOD BANK

## 2022-08-15 LAB — ETHANOL: Alcohol, Ethyl (B): <10 mg/dL (ref <10)

## 2022-08-15 MED ORDER — IOHEXOL 350 MG/ML SOLN
75.0000 mL | Freq: Once | INTRAVENOUS | Status: AC | PRN
  Administered 2022-08-15: 75 mL via INTRAVENOUS

## 2022-08-15 MED ORDER — METHOCARBAMOL 500 MG PO TABS: 500.0000 mg | ORAL_TABLET | Freq: Two times a day (BID) | ORAL | 0 refills | Status: DC

## 2022-08-15 MED ORDER — TETANUS-DIPHTH-ACELL PERTUSSIS 5-2.5-18.5 LF-MCG/0.5 IM SUSY
0.5000 mL | PREFILLED_SYRINGE | Freq: Once | INTRAMUSCULAR | Status: AC
  Administered 2022-08-15: 0.5 mL via INTRAMUSCULAR
  Filled 2022-08-15: qty 0.5

## 2022-08-15 MED ORDER — ONDANSETRON HCL 4 MG/2ML IJ SOLN
INTRAMUSCULAR | Status: AC
  Filled 2022-08-15: qty 2

## 2022-08-15 MED ORDER — METHOCARBAMOL 500 MG PO TABS
500.0000 mg | ORAL_TABLET | Freq: Once | ORAL | Status: AC
  Administered 2022-08-15: 500 mg via ORAL
  Filled 2022-08-15: qty 1

## 2022-08-15 MED ORDER — FENTANYL CITRATE PF 50 MCG/ML IJ SOSY
50.0000 ug | PREFILLED_SYRINGE | Freq: Once | INTRAMUSCULAR | Status: AC
  Administered 2022-08-15: 50 ug via INTRAVENOUS

## 2022-08-15 MED ORDER — OXYCODONE-ACETAMINOPHEN 5-325 MG PO TABS
1.0000 | ORAL_TABLET | Freq: Once | ORAL | Status: AC
  Administered 2022-08-15: 1 via ORAL
  Filled 2022-08-15: qty 1

## 2022-08-15 MED ORDER — OXYCODONE-ACETAMINOPHEN 5-325 MG PO TABS: 1.0000 | ORAL_TABLET | Freq: Four times a day (QID) | ORAL | 0 refills | Status: DC | PRN

## 2022-08-15 MED ORDER — ONDANSETRON HCL 4 MG/2ML IJ SOLN
4.0000 mg | Freq: Once | INTRAMUSCULAR | Status: AC
  Administered 2022-08-15: 4 mg via INTRAVENOUS

## 2022-08-15 MED ORDER — IBUPROFEN 800 MG PO TABS: 800.0000 mg | ORAL_TABLET | Freq: Three times a day (TID) | ORAL | 0 refills | Status: DC

## 2022-08-15 MED ORDER — FENTANYL CITRATE (PF) 100 MCG/2ML IJ SOLN
INTRAMUSCULAR | Status: AC
  Filled 2022-08-15: qty 2

## 2022-08-15 NOTE — Progress Notes (Signed)
Orthopedic Tech Progress Note Patient Details:  Scott Leffler Sr. [birth year] [id number]  Ortho Devices Type of Ortho Device: Thumb velcro splint Ortho Device/Splint Location: LUE Ortho Device/Splint Interventions: Ordered     ABD thumb brace given to RN. Vernona Rieger 08/15/2022, 11:47 AM
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
## 2022-08-15 NOTE — ED Notes (Signed)
Pt able to ambulate without assistance and dizziness.

## 2022-08-15 NOTE — ED Notes (Addendum)
Pt ambulated.  Neck and back pain with some dizziness, though pt was able to ambulate.  MD notified.
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
## 2022-08-15 NOTE — Progress Notes (Signed)
Orthopedic Tech Progress Note Patient Details:  Scott Banko Sr. 1971-03-21 [id number]  Patient ID: [name], male   DOB: 08-24-1970, 52 y.o.   MRN: [id number] Level II; not currently needed. Vernona Rieger 08/15/2022, 8:41 AM
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
## 2022-09-23 ENCOUNTER — Ambulatory Visit (HOSPITAL_BASED_OUTPATIENT_CLINIC_OR_DEPARTMENT_OTHER): Payer: BLUE CROSS/BLUE SHIELD | Admitting: General Surgery

## 2022-10-13 ENCOUNTER — Other Ambulatory Visit: Payer: Self-pay | Admitting: Orthopedic Surgery

## 2022-11-02 ENCOUNTER — Ambulatory Visit: Admit: 2022-11-02 | Payer: BLUE CROSS/BLUE SHIELD | Admitting: Orthopedic Surgery

## 2022-11-02 SURGERY — REPAIR, LIGAMENT, ULNAR COLLATERAL
Anesthesia: Choice | Laterality: Left

## 2024-01-18 ENCOUNTER — Ambulatory Visit: Payer: Self-pay | Admitting: Surgery
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
## 2024-01-24 NOTE — Pre-Procedure Instructions (Signed)
 Surgical Instructions   Your procedure is scheduled on Friday, June 27th. Report to [facility] Main Entrance A at 07:30 A.M., then check in with the Admitting office. Any questions or running late day of surgery: call [phone]  Questions prior to your surgery date: call [phone], Monday-Friday, 8am-4pm. If you experience any cold or flu symptoms such as cough, fever, chills, shortness of breath, etc. between now and your scheduled surgery, please notify us  at the above number.     Remember:  Do not eat after midnight the night before your surgery   You may drink clear liquids until 06:30 AM the morning of your surgery.   Clear liquids allowed are: Water, Non-Citrus Juices (without pulp), Carbonated Beverages, Clear Tea (no milk, honey, etc.), Black Coffee Only (NO MILK, CREAM OR POWDERED CREAMER of any kind), and Gatorade.    Take these medicines the morning of surgery with A SIP OF WATER  allopurinol (ZYLOPRIM)  gemfibrozil (LOPID)    One week prior to surgery, STOP taking any Aspirin (unless otherwise instructed by your surgeon) Aleve, Naproxen, Ibuprofen , Motrin , Advil , Goody's, BC's, all herbal medications, fish oil, and non-prescription vitamins.                     Do NOT Smoke (Tobacco/Vaping) for 24 hours prior to your procedure.  If you use a CPAP at night, you may bring your mask/headgear for your overnight stay.   You will be asked to remove any contacts, glasses, piercing's, hearing aid's, dentures/partials prior to surgery. Please bring cases for these items if needed.    Patients discharged the day of surgery will not be allowed to drive home, and someone needs to stay with them for 24 hours.  SURGICAL WAITING ROOM VISITATION Patients may have no more than 2 support people in the waiting area - these visitors may rotate.   Pre-op nurse will coordinate an appropriate time for 1 ADULT support person, who may not rotate, to accompany patient in pre-op.   Children under the age of 23 must have an adult with them who is not the patient and must remain in the main waiting area with an adult.  If the patient needs to stay at the hospital during part of their recovery, the visitor guidelines for inpatient rooms apply.  Please refer to the [facility] website for the visitor guidelines for any additional information.   If you received a COVID test during your pre-op visit  it is requested that you wear a mask when out in public, stay away from anyone that may not be feeling well and notify your surgeon if you develop symptoms. If you have been in contact with anyone that has tested positive in the last 10 days please notify you surgeon.      Pre-operative CHG Bathing Instructions   You can play a key role in reducing the risk of infection after surgery. Your skin needs to be as free of germs as possible. You can reduce the number of germs on your skin by washing with CHG (chlorhexidine gluconate) soap before surgery. CHG is an antiseptic soap that kills germs and continues to kill germs even after washing.   DO NOT use if you have an allergy to chlorhexidine/CHG or antibacterial soaps. If your skin becomes reddened or irritated, stop using the CHG and notify one of our RNs at [phone].              TAKE A SHOWER THE NIGHT BEFORE  SURGERY AND THE DAY OF SURGERY    Please keep in mind the following:  DO NOT shave, including legs and underarms, 48 hours prior to surgery.   You may shave your face before/day of surgery.  Place clean sheets on your bed the night before surgery Use a clean washcloth (not used since being washed) for each shower. DO NOT sleep with pet's night before surgery.  CHG Shower Instructions:  Wash your face and private area with normal soap. If you choose to wash your hair, wash first with your normal shampoo.  After you use shampoo/soap, rinse your hair and body thoroughly to remove shampoo/soap residue.  Turn the  water OFF and apply half the bottle of CHG soap to a CLEAN washcloth.  Apply CHG soap ONLY FROM YOUR NECK DOWN TO YOUR TOES (washing for 3-5 minutes)  DO NOT use CHG soap on face, private areas, open wounds, or sores.  Pay special attention to the area where your surgery is being performed.  If you are having back surgery, having someone wash your back for you may be helpful. Wait 2 minutes after CHG soap is applied, then you may rinse off the CHG soap.  Pat dry with a clean towel  Put on clean pajamas    Additional instructions for the day of surgery: DO NOT APPLY any lotions, deodorants, cologne, or perfumes.   Do not wear jewelry or makeup Do not wear nail polish, gel polish, artificial nails, or any other type of covering on natural nails (fingers and toes) Do not bring valuables to the hospital. [facility] is not responsible for valuables/personal belongings. Put on clean/comfortable clothes.  Please brush your teeth.  Ask your nurse before applying any prescription medications to the skin.

## 2024-01-25 ENCOUNTER — Other Ambulatory Visit: Payer: Self-pay

## 2024-01-25 ENCOUNTER — Encounter (HOSPITAL_COMMUNITY)
Admission: RE | Admit: 2024-01-25 | Discharge: 2024-01-25 | Disposition: A | Discharge status: Home / Self Care | Source: Ambulatory Visit | Attending: Surgery | Admitting: Surgery

## 2024-01-25 ENCOUNTER — Encounter (HOSPITAL_COMMUNITY): Payer: Self-pay

## 2024-01-25 VITALS — BP 131/80 | HR 91 | Temp 98.6°F | Resp 17 | Ht 69.0 in | Wt 217.0 lb

## 2024-01-25 DIAGNOSIS — Z01812 Encounter for preprocedural laboratory examination: Secondary | ICD-10-CM | POA: Insufficient documentation

## 2024-01-25 DIAGNOSIS — Z01818 Encounter for other preprocedural examination: Secondary | ICD-10-CM

## 2024-01-25 LAB — BASIC METABOLIC PANEL WITH GFR
Anion gap: 11 (ref 5–15)
BUN: 22 mg/dL — ABNORMAL HIGH (ref 6–20)
CO2: 25 mmol/L (ref 22–32)
Calcium: 9.8 mg/dL (ref 8.9–10.3)
Chloride: 106 mmol/L (ref 98–111)
Creatinine, Ser: 1.26 mg/dL — ABNORMAL HIGH (ref 0.61–1.24)
GFR, Estimated: >60 mL/min (ref >60)
Glucose, Bld: 148 mg/dL — ABNORMAL HIGH (ref 70–99)
Potassium: 3.8 mmol/L (ref 3.5–5.1)
Sodium: 142 mmol/L (ref 135–145)

## 2024-01-25 LAB — CBC
HCT: 43.9 % (ref 39.0–52.0)
Hemoglobin: 14.9 g/dL (ref 13.0–17.0)
MCH: 32.2 pg (ref 26.0–34.0)
MCHC: 33.9 g/dL (ref 30.0–36.0)
MCV: 94.8 fL (ref 80.0–100.0)
Platelets: 258 10*3/uL (ref 150–400)
RBC: 4.63 MIL/uL (ref 4.22–5.81)
RDW: 12.7 % (ref 11.5–15.5)
WBC: 5.2 10*3/uL (ref 4.0–10.5)
nRBC: 0 % (ref 0.0–0.2)

## 2024-01-25 NOTE — Progress Notes (Signed)
 PCP - Alm Rav  Cardiologist -   PPM/ICD - denies Device Orders - n/a Rep Notified - n/a  Chest x-ray - denies EKG - denies Stress Test - denies ECHO - denies Cardiac Cath - denies  Sleep Study - denies CPAP - n/a  DM -denies  Blood Thinner Instructions: denies Aspirin Instructions:n/a  ERAS Protcol - clear liquids until 6:30 am.  COVID TEST- n/a   Anesthesia review: no  Patient denies shortness of breath, fever, cough and chest pain at PAT appointment   All instructions explained to the patient, with a verbal understanding of the material. Patient agrees to go over the instructions while at home for a better understanding. Patient also instructed to self quarantine after being tested for COVID-19. The opportunity to ask questions was provided.

## 2024-01-27 ENCOUNTER — Encounter (HOSPITAL_COMMUNITY): Payer: Self-pay | Admitting: Surgery

## 2024-01-27 ENCOUNTER — Ambulatory Visit (HOSPITAL_BASED_OUTPATIENT_CLINIC_OR_DEPARTMENT_OTHER): Admitting: Anesthesiology

## 2024-01-27 ENCOUNTER — Encounter (HOSPITAL_COMMUNITY)
Admission: RE | Disposition: A | Discharge status: Home / Self Care | Payer: Self-pay | Source: Home / Self Care | Attending: Surgery

## 2024-01-27 ENCOUNTER — Ambulatory Visit (HOSPITAL_COMMUNITY)
Admission: RE | Admit: 2024-01-27 | Discharge: 2024-01-27 | Disposition: A | Discharge status: Home / Self Care | Attending: Surgery | Admitting: Surgery

## 2024-01-27 ENCOUNTER — Ambulatory Visit (HOSPITAL_COMMUNITY): Admitting: Anesthesiology

## 2024-01-27 ENCOUNTER — Other Ambulatory Visit: Payer: Self-pay

## 2024-01-27 DIAGNOSIS — K429 Umbilical hernia without obstruction or gangrene: Secondary | ICD-10-CM | POA: Diagnosis present

## 2024-01-27 DIAGNOSIS — Z87891 Personal history of nicotine dependence: Secondary | ICD-10-CM | POA: Diagnosis not present

## 2024-01-27 HISTORY — PX: UMBILICAL HERNIA REPAIR: SHX196

## 2024-01-27 HISTORY — PX: INSERTION OF MESH: SHX5868

## 2024-01-27 SURGERY — REPAIR, HERNIA, UMBILICAL, ADULT
Anesthesia: General | Site: Abdomen

## 2024-01-27 MED ORDER — CEFAZOLIN SODIUM-DEXTROSE 2-4 GM/100ML-% IV SOLN
2.0000 g | INTRAVENOUS | Status: AC
  Administered 2024-01-27: 2 g via INTRAVENOUS

## 2024-01-27 MED ORDER — PROPOFOL 10 MG/ML IV BOLUS
INTRAVENOUS | Status: AC
Start: 2024-01-27 — End: 2024-01-27
  Filled 2024-01-27: qty 20

## 2024-01-27 MED ORDER — MIDAZOLAM HCL 2 MG/2ML IJ SOLN
INTRAMUSCULAR | Status: DC | PRN
  Administered 2024-01-27: 2 mg via INTRAVENOUS

## 2024-01-27 MED ORDER — PHENYLEPHRINE 80 MCG/ML (10ML) SYRINGE FOR IV PUSH (FOR BLOOD PRESSURE SUPPORT)
PREFILLED_SYRINGE | INTRAVENOUS | Status: AC
  Filled 2024-01-27: qty 10

## 2024-01-27 MED ORDER — LIDOCAINE 2% (20 MG/ML) 5 ML SYRINGE
INTRAMUSCULAR | Status: DC | PRN
  Administered 2024-01-27: 50 mg via INTRAVENOUS

## 2024-01-27 MED ORDER — DEXAMETHASONE SODIUM PHOSPHATE 10 MG/ML IJ SOLN
INTRAMUSCULAR | Status: DC | PRN
  Administered 2024-01-27: 10 mg via INTRAVENOUS

## 2024-01-27 MED ORDER — ACETAMINOPHEN 500 MG PO TABS
ORAL_TABLET | ORAL | Status: AC
  Administered 2024-01-27: 1000 mg via ORAL
  Filled 2024-01-27: qty 2

## 2024-01-27 MED ORDER — 0.9 % SODIUM CHLORIDE (POUR BTL) OPTIME
TOPICAL | Status: DC | PRN
  Administered 2024-01-27: 1000 mL

## 2024-01-27 MED ORDER — ROCURONIUM BROMIDE 10 MG/ML (PF) SYRINGE
PREFILLED_SYRINGE | INTRAVENOUS | Status: DC | PRN
  Administered 2024-01-27: 60 mg via INTRAVENOUS

## 2024-01-27 MED ORDER — SUGAMMADEX SODIUM 200 MG/2ML IV SOLN
INTRAVENOUS | Status: DC | PRN
  Administered 2024-01-27: 200 mg via INTRAVENOUS

## 2024-01-27 MED ORDER — PROPOFOL 10 MG/ML IV BOLUS
INTRAVENOUS | Status: DC | PRN
  Administered 2024-01-27: 150 mg via INTRAVENOUS

## 2024-01-27 MED ORDER — ORAL CARE MOUTH RINSE: 15.0000 mL | Freq: Once | OROMUCOSAL | Status: AC

## 2024-01-27 MED ORDER — DROPERIDOL 2.5 MG/ML IJ SOLN: 0.6250 mg | Freq: Once | INTRAMUSCULAR | Status: DC | PRN

## 2024-01-27 MED ORDER — DEXAMETHASONE SODIUM PHOSPHATE 10 MG/ML IJ SOLN
INTRAMUSCULAR | Status: AC
  Filled 2024-01-27: qty 1

## 2024-01-27 MED ORDER — CEFAZOLIN SODIUM-DEXTROSE 2-4 GM/100ML-% IV SOLN
INTRAVENOUS | Status: AC
  Filled 2024-01-27: qty 100

## 2024-01-27 MED ORDER — HYDROCODONE-ACETAMINOPHEN 5-325 MG PO TABS: 1.0000 | ORAL_TABLET | Freq: Four times a day (QID) | ORAL | 0 refills | Status: AC | PRN

## 2024-01-27 MED ORDER — EPHEDRINE SULFATE-NACL 50-0.9 MG/10ML-% IV SOSY
PREFILLED_SYRINGE | INTRAVENOUS | Status: DC | PRN
  Administered 2024-01-27: 5 mg via INTRAVENOUS

## 2024-01-27 MED ORDER — LIDOCAINE 2% (20 MG/ML) 5 ML SYRINGE
INTRAMUSCULAR | Status: AC
  Filled 2024-01-27: qty 5

## 2024-01-27 MED ORDER — ACETAMINOPHEN 500 MG PO TABS: 1000.0000 mg | ORAL_TABLET | ORAL | Status: AC

## 2024-01-27 MED ORDER — DEXMEDETOMIDINE HCL IN NACL 80 MCG/20ML IV SOLN
INTRAVENOUS | Status: DC | PRN
  Administered 2024-01-27 (×2): 8 ug via INTRAVENOUS

## 2024-01-27 MED ORDER — ALBUTEROL SULFATE HFA 108 (90 BASE) MCG/ACT IN AERS
INHALATION_SPRAY | RESPIRATORY_TRACT | Status: DC | PRN
Start: 2024-01-27 — End: 2024-01-27
  Administered 2024-01-27: 4 via RESPIRATORY_TRACT

## 2024-01-27 MED ORDER — CELECOXIB 200 MG PO CAPS: 200.0000 mg | ORAL_CAPSULE | ORAL | Status: AC

## 2024-01-27 MED ORDER — FENTANYL CITRATE (PF) 250 MCG/5ML IJ SOLN
INTRAMUSCULAR | Status: DC | PRN
  Administered 2024-01-27: 100 ug via INTRAVENOUS

## 2024-01-27 MED ORDER — ONDANSETRON HCL 4 MG/2ML IJ SOLN
INTRAMUSCULAR | Status: DC | PRN
  Administered 2024-01-27: 4 mg via INTRAVENOUS

## 2024-01-27 MED ORDER — FENTANYL CITRATE (PF) 250 MCG/5ML IJ SOLN
INTRAMUSCULAR | Status: AC
  Filled 2024-01-27: qty 5

## 2024-01-27 MED ORDER — CELECOXIB 200 MG PO CAPS
ORAL_CAPSULE | ORAL | Status: AC
  Administered 2024-01-27: 200 mg via ORAL
  Filled 2024-01-27: qty 1

## 2024-01-27 MED ORDER — ROCURONIUM BROMIDE 10 MG/ML (PF) SYRINGE
PREFILLED_SYRINGE | INTRAVENOUS | Status: AC
  Filled 2024-01-27: qty 10

## 2024-01-27 MED ORDER — BUPIVACAINE-EPINEPHRINE (PF) 0.25% -1:200000 IJ SOLN
INTRAMUSCULAR | Status: AC
  Filled 2024-01-27: qty 30

## 2024-01-27 MED ORDER — BUPIVACAINE-EPINEPHRINE 0.25% -1:200000 IJ SOLN
INTRAMUSCULAR | Status: DC | PRN
  Administered 2024-01-27: 30 mL

## 2024-01-27 MED ORDER — CHLORHEXIDINE GLUCONATE 0.12 % MT SOLN: 15.0000 mL | Freq: Once | OROMUCOSAL | Status: AC

## 2024-01-27 MED ORDER — CELECOXIB 200 MG PO CAPS: 200.0000 mg | ORAL_CAPSULE | Freq: Once | ORAL | Status: DC

## 2024-01-27 MED ORDER — LACTATED RINGERS IV SOLN: INTRAVENOUS | Status: DC

## 2024-01-27 MED ORDER — MIDAZOLAM HCL 2 MG/2ML IJ SOLN
INTRAMUSCULAR | Status: AC
  Filled 2024-01-27: qty 2

## 2024-01-27 MED ORDER — FENTANYL CITRATE (PF) 100 MCG/2ML IJ SOLN: 25.0000 ug | INTRAMUSCULAR | Status: DC | PRN

## 2024-01-27 MED ORDER — ACETAMINOPHEN 500 MG PO TABS: 1000.0000 mg | ORAL_TABLET | Freq: Once | ORAL | Status: DC

## 2024-01-27 MED ORDER — CHLORHEXIDINE GLUCONATE 0.12 % MT SOLN
OROMUCOSAL | Status: AC
  Administered 2024-01-27: 15 mL via OROMUCOSAL
  Filled 2024-01-27: qty 15

## 2024-01-27 MED ORDER — ONDANSETRON HCL 4 MG/2ML IJ SOLN
INTRAMUSCULAR | Status: AC
  Filled 2024-01-27: qty 2

## 2024-01-27 SURGICAL SUPPLY — 28 items
BAG COUNTER SPONGE SURGICOUNT (BAG) ×3 IMPLANT
BLADE CLIPPER SURG (BLADE) IMPLANT
CANISTER SUCTION 3000ML PPV (SUCTIONS) IMPLANT
CHLORAPREP W/TINT 26 (MISCELLANEOUS) ×3 IMPLANT
COVER SURGICAL LIGHT HANDLE (MISCELLANEOUS) ×3 IMPLANT
DERMABOND ADVANCED .7 DNX12 (GAUZE/BANDAGES/DRESSINGS) ×3 IMPLANT
DRAPE LAPAROTOMY 100X72 PEDS (DRAPES) ×3 IMPLANT
ELECT CAUTERY BLADE 6.4 (BLADE) IMPLANT
ELECTRODE REM PT RTRN 9FT ADLT (ELECTROSURGICAL) ×3 IMPLANT
GLOVE BIOGEL PI IND STRL 6 (GLOVE) ×3 IMPLANT
GLOVE BIOGEL PI MICRO STRL 5.5 (GLOVE) ×3 IMPLANT
GOWN STRL REUS W/ TWL LRG LVL3 (GOWN DISPOSABLE) ×6 IMPLANT
KIT BASIN OR (CUSTOM PROCEDURE TRAY) ×3 IMPLANT
KIT TURNOVER KIT B (KITS) ×3 IMPLANT
MESH VENTRALEX ST 2.5 CRC MED (Mesh General) IMPLANT
NDL HYPO 25GX1X1/2 BEV (NEEDLE) ×3 IMPLANT
NEEDLE HYPO 25GX1X1/2 BEV (NEEDLE) ×2 IMPLANT
NS IRRIG 1000ML POUR BTL (IV SOLUTION) ×3 IMPLANT
PACK GENERAL/GYN (CUSTOM PROCEDURE TRAY) ×3 IMPLANT
PAD ARMBOARD POSITIONER FOAM (MISCELLANEOUS) ×3 IMPLANT
PENCIL SMOKE EVACUATOR (MISCELLANEOUS) ×3 IMPLANT
SUT MNCRL AB 4-0 PS2 18 (SUTURE) ×3 IMPLANT
SUT NOVA NAB DX-16 0-1 5-0 T12 (SUTURE) ×3 IMPLANT
SUT NOVA NAB GS-21 0 18 T12 DT (SUTURE) ×3 IMPLANT
SUT VIC AB 3-0 SH 27X BRD (SUTURE) ×3 IMPLANT
SYR CONTROL 10ML LL (SYRINGE) ×3 IMPLANT
TOWEL GREEN STERILE (TOWEL DISPOSABLE) ×3 IMPLANT
TOWEL GREEN STERILE FF (TOWEL DISPOSABLE) ×3 IMPLANT

## 2024-01-27 NOTE — Anesthesia Postprocedure Evaluation (Signed)
 Anesthesia Post Note  Patient: Scott Hailstone Sr.  Procedure(s) Performed: UMBILICAL HERNIA REPAIR (Abdomen) INSERTION OF MESH (Abdomen)     Patient location during evaluation: PACU Anesthesia Type: General Level of consciousness: awake and alert Pain management: pain level controlled Vital Signs Assessment: post-procedure vital signs reviewed and stable Respiratory status: spontaneous breathing, nonlabored ventilation, respiratory function stable and patient connected to nasal cannula oxygen Cardiovascular status: blood pressure returned to baseline and stable Postop Assessment: no apparent nausea or vomiting Anesthetic complications: no   There were no known notable events for this encounter.  Last Vitals:  Vitals:   01/27/24 1230 01/27/24 1245  BP: 116/79 121/79  Pulse: 68 68  Resp: 14 12  Temp:  36.4 C  SpO2: 93% 94%    Last Pain:  Vitals:   01/27/24 1200  PainSc: 0-No pain                 Cordella P Cristobal Advani

## 2024-01-27 NOTE — Anesthesia Procedure Notes (Signed)
 Procedure Name: Intubation Date/Time: 01/27/2024 10:25 AM  Performed by: Tressie Gilmore RAMAN, CRNAPre-anesthesia Checklist: Patient identified, Emergency Drugs available, Suction available and Patient being monitored Patient Re-evaluated:Patient Re-evaluated prior to induction Oxygen Delivery Method: Circle System Utilized Preoxygenation: Pre-oxygenation with 100% oxygen Induction Type: IV induction Ventilation: Mask ventilation without difficulty Laryngoscope Size: Mac and 3 Grade View: Grade II Tube type: Oral Tube size: 7.5 mm Number of attempts: 1 Airway Equipment and Method: Stylet and Oral airway Placement Confirmation: ETT inserted through vocal cords under direct vision, positive ETCO2 and breath sounds checked- equal and bilateral Secured at: 21 cm Tube secured with: Tape Dental Injury: Teeth and Oropharynx as per pre-operative assessment

## 2024-01-27 NOTE — Op Note (Signed)
 Date: 01/27/24  Patient: Scott Avey Sr. MRN: [id number]  Preoperative Diagnosis: Umbilical hernia Postoperative Diagnosis: Same  Procedure: Open umbilical hernia repair with mesh  Surgeon: Leonor Dawn, MD  EBL: Minimal  Anesthesia: General endotracheal  Specimens: None  Indications: [name] is a 53 yo male who presented with an umbilical hernia, which is reducible but has more recently become symptomatic, particularly with activity. After a discussion of the risks and benefits of surgery, he agreed to proceed with repair.  Findings: Umbilical hernia defect measuring 2cm in length after opening the sac. Hernia defect was estimated to be 1.5cm preoperatively on exam. Hernia was repaired with a 6.4cm Ventralex intraperitoneal onlay mesh.  Procedure details: Informed consent was obtained in the preoperative area prior to the procedure. The patient was brought to the operating room and placed on the table in the supine position. General anesthesia was induced and appropriate lines and drains were placed for intraoperative monitoring. Perioperative antibiotics were administered per SCIP guidelines. The abdomen was prepped and draped in the usual sterile fashion. A pre-procedure timeout was taken verifying patient identity, surgical site and procedure to be performed.  A curvilinear infraumbilical skin incision was made and the subcutaneous tissue was divided with cautery down to the fascia.  The umbilical stalk was circumferentially dissected out using blunt dissection, and the umbilical skin was sharply separated from the underlying stalk and hernia sac.  The hernia sac was opened and circumferentially dissected off the fascia using cautery.  The sac was excised and discarded.  The resulting hernia defect was measured at 2cm in diameter.  Subcutaneous flaps were raised circumferentially around the fascia using cautery.  The peritoneal side of the fascia was circumferentially palpated  and no additional hernia defects were identified.  A 6.4cm circular Ventralex mesh was brought onto the field.  The mesh was secured as an intraperitoneal underlay using 0 Novafil transfascial sutures placed at the 4 cardinal points.  The mesh was then pulled flush with the abdominal wall and the edges were palpated to ensure there was no bowel between the mesh and the abdominal wall.  The mesh lay flat and in good position.  The transfascial sutures were then tied down.  The fascia was closed transversely over the mesh using interrupted 1 Novafil sutures.  The wound appeared hemostatic.  The umbilical skin was tacked down to the fascia using a 3-0 Vicryl suture.  The deep dermis was closed with interrupted 3-0 Vicryl sutures, and the subcuticular was closed with a running 4-0 Monocryl suture.  Dermabond was applied.  The patient tolerated the procedure well with no apparent complications.  All counts were correct x2 at the end of the procedure. The patient was extubated and taken to PACU in stable condition.  Leonor Dawn, MD 01/27/24 11:24 AM

## 2024-01-27 NOTE — Anesthesia Preprocedure Evaluation (Addendum)
 Anesthesia Evaluation  Patient identified by MRN, date of birth, ID band Patient awake    Reviewed: Allergy & Precautions, NPO status , Patient's Chart, lab work & pertinent test results  Airway Mallampati: III  TM Distance: >3 FB Neck ROM: Full    Dental no notable dental hx.    Pulmonary Current Smoker   Pulmonary exam normal        Cardiovascular negative cardio ROS  Rhythm:Regular Rate:Normal     Neuro/Psych negative neurological ROS  negative psych ROS   GI/Hepatic Neg liver ROS,,,Umbilical hernia    Endo/Other  negative endocrine ROS    Renal/GU negative Renal ROS  negative genitourinary   Musculoskeletal negative musculoskeletal ROS (+)    Abdominal Normal abdominal exam  (+)   Peds  Hematology Lab Results      Component                Value               Date                      WBC                      5.2                 01/25/2024                HGB                      14.9                01/25/2024                HCT                      43.9                01/25/2024                MCV                      94.8                01/25/2024                PLT                      258                 01/25/2024              Anesthesia Other Findings   Reproductive/Obstetrics                             Anesthesia Physical Anesthesia Plan  ASA: 2  Anesthesia Plan: General   Post-op Pain Management: Celebrex PO (pre-op)* and Tylenol  PO (pre-op)*   Induction: Intravenous  PONV Risk Score and Plan: 1 and Ondansetron , Dexamethasone, Midazolam and Treatment may vary due to age or medical condition  Airway Management Planned: Mask and Oral ETT  Additional Equipment: None  Intra-op Plan:   Post-operative Plan: Extubation in OR  Informed Consent: I have reviewed the patients History and Physical, chart, labs and discussed the procedure including the risks, benefits and  alternatives for the proposed anesthesia with the patient  or authorized representative who has indicated his/her understanding and acceptance.     Dental advisory given  Plan Discussed with: CRNA  Anesthesia Plan Comments:        Anesthesia Quick Evaluation

## 2024-01-27 NOTE — Transfer of Care (Signed)
 Immediate Anesthesia Transfer of Care Note  Patient: Scott Hailstone Sr.  Procedure(s) Performed: UMBILICAL HERNIA REPAIR (Abdomen) INSERTION OF MESH (Abdomen)  Patient Location: PACU  Anesthesia Type:General  Level of Consciousness: drowsy  Airway & Oxygen Therapy: Patient Spontanous Breathing and Patient connected to nasal cannula oxygen  Post-op Assessment: Report given to RN and Post -op Vital signs reviewed and stable  Post vital signs: Reviewed and stable  Last Vitals:  Vitals Value Taken Time  BP 104/68 01/27/24 11:33  Temp 35.9 C 01/27/24 11:33  Pulse 81 01/27/24 11:43  Resp 15 01/27/24 11:43  SpO2 95 % 01/27/24 11:43  Vitals shown include unfiled device data.  Last Pain:  Vitals:   01/27/24 1133  PainSc: Asleep         Complications: There were no known notable events for this encounter.

## 2024-01-27 NOTE — Discharge Instructions (Addendum)
 [facility] DISCHARGE INSTRUCTIONS  Activity No heavy lifting greater than 15 pounds for 6-8 weeks after surgery. Ok to shower in 24 hours, but do not bathe or submerge incisions underwater. Do not drive while taking narcotic pain medication. You may drive when you are no longer taking prescription pain medication, you can comfortably wear a seatbelt, and you can safely maneuver your car and apply brakes.  Wound Care Your incision is covered with skin glue called Dermabond. This will peel off on its own over time. You may shower in 24 hours and allow warm soapy water to run over your incisions. Gently pat dry. Do not submerge your incision underwater for 2 weeks. Monitor your incision for any new redness, tenderness, or drainage. Many patients will experience some swelling and bruising at the incisions.  Ice packs will help.  Swelling and bruising can take several days to resolve.   Medications A  prescription for pain medication may be given to you upon discharge.  Take your pain medication as prescribed, if needed.  If narcotic pain medicine is not needed, then you may take acetaminophen  (Tylenol ) or ibuprofen  (Advil ) as needed. It is common to experience some constipation if taking pain medication after surgery.  Increasing fluid intake and taking a stool softener (such as Colace) will usually help or prevent this problem from occurring.  A mild laxative (Milk of Magnesia or Miralax) should be taken according to package directions if there are no bowel movements after 48 hours. Take your usually prescribed medications unless otherwise directed. If you need a refill on your pain medication, please contact your pharmacy.  They will contact our office to request authorization. Prescriptions will not be filled after 5 pm or on weekends.  When to Call Us : Fever greater than 100.5 New redness, drainage, or swelling at incision site Severe pain, nausea, or vomiting Persistent  bleeding from incisions  Follow-up You have an appointment scheduled with Dr. Dasie on February 16, 2024 at 3:00pm. This will be at the [facility] office at [address], [address], [place], [place]. Please arrive at least 15 minutes prior to your scheduled appointment time.  IF YOU HAVE DISABILITY OR FAMILY LEAVE FORMS, YOU MUST BRING THEM TO THE OFFICE FOR PROCESSING.   DO NOT GIVE THEM TO YOUR DOCTOR.  The clinic staff is available to answer your questions during regular business hours.  Please don't hesitate to call and ask to speak to one of the nurses for clinical concerns.  If you have a medical emergency, go to the nearest emergency room or call 911.  A surgeon from [facility] is always on call at the hospital  [address], [place], [place]  [address] ?  [address] [address], [place], [place]   [address] [phone] ? Toll Free: [phone] ? FAX [phone] Web site: [email or web address]      Managing Your Pain After Surgery Without Opioids    Thank you for participating in our program to help patients manage their pain after surgery without opioids. This is part of our effort to provide you with the best care possible, without exposing you or your family to the risk that opioids pose.  What pain can I expect after surgery? You can expect to have some pain after surgery. This is normal. The pain is typically worse the day after surgery, and quickly begins to get better. Many studies have found that many patients are able to manage their  pain after surgery with Over-the-Counter (OTC) medications such as Tylenol  and Motrin . If you have a condition that does not allow you to take Tylenol  or Motrin , notify your surgical team.  How will I manage my pain? The best strategy for controlling your pain after surgery is around the clock pain control with Tylenol  (acetaminophen ) and Motrin  (ibuprofen  or Advil ). Alternating these  medications with each other allows you to maximize your pain control. In addition to Tylenol  and Motrin , you can use heating pads or ice packs on your incisions to help reduce your pain.  How will I alternate your regular strength over-the-counter pain medication? You will take a dose of pain medication every three hours. Start by taking 650 mg of Tylenol  (2 pills of 325 mg) 3 hours later take 600 mg of Motrin  (3 pills of 200 mg) 3 hours after taking the Motrin  take 650 mg of Tylenol  3 hours after that take 600 mg of Motrin .   - 1 -  See example - if your first dose of Tylenol  is at 12:00 PM   12:00 PM Tylenol  650 mg (2 pills of 325 mg)  3:00 PM Motrin  600 mg (3 pills of 200 mg)  6:00 PM Tylenol  650 mg (2 pills of 325 mg)  9:00 PM Motrin  600 mg (3 pills of 200 mg)  Continue alternating every 3 hours   We recommend that you follow this schedule around-the-clock for at least 3 days after surgery, or until you feel that it is no longer needed. Use the table on the last page of this handout to keep track of the medications you are taking. Important: Do not take more than 3000mg  of Tylenol  or 3200mg  of Motrin  in a 24-hour period. Do not take ibuprofen /Motrin  if you have a history of bleeding stomach ulcers, severe kidney disease, &/or actively taking a blood thinner  What if I still have pain? If you have pain that is not controlled with the over-the-counter pain medications (Tylenol  and Motrin  or Advil ) you might have what we call "breakthrough" pain. You will receive a prescription for a small amount of an opioid pain medication such as Oxycodone , Tramadol, or Tylenol  with Codeine. Use these opioid pills in the first 24 hours after surgery if you have breakthrough pain. Do not take more than 1 pill every 4-6 hours.  If you still have uncontrolled pain after using all opioid pills, don't hesitate to call our staff using the number provided. We will help make sure you are managing your pain  in the best way possible, and if necessary, we can provide a prescription for additional pain medication.   Day 1    Time  Name of Medication Number of pills taken  Amount of Acetaminophen   Pain Level   Comments  AM PM       AM PM       AM PM       AM PM       AM PM       AM PM       AM PM       AM PM       Total Daily amount of Acetaminophen  Do not take more than  3,000 mg per day      Day 2    Time  Name of Medication Number of pills taken  Amount of Acetaminophen   Pain Level   Comments  AM PM       AM PM  AM PM       AM PM       AM PM       AM PM       AM PM       AM PM       Total Daily amount of Acetaminophen  Do not take more than  3,000 mg per day      Day 3    Time  Name of Medication Number of pills taken  Amount of Acetaminophen   Pain Level   Comments  AM PM       AM PM       AM PM       AM PM         AM PM       AM PM       AM PM       AM PM       Total Daily amount of Acetaminophen  Do not take more than  3,000 mg per day      Day 4    Time  Name of Medication Number of pills taken  Amount of Acetaminophen   Pain Level   Comments  AM PM       AM PM       AM PM       AM PM       AM PM       AM PM       AM PM       AM PM       Total Daily amount of Acetaminophen  Do not take more than  3,000 mg per day      Day 5    Time  Name of Medication Number of pills taken  Amount of Acetaminophen   Pain Level   Comments  AM PM       AM PM       AM PM       AM PM       AM PM       AM PM       AM PM       AM PM       Total Daily amount of Acetaminophen  Do not take more than  3,000 mg per day      Day 6    Time  Name of Medication Number of pills taken  Amount of Acetaminophen   Pain Level  Comments  AM PM       AM PM       AM PM       AM PM       AM PM       AM PM       AM PM       AM PM       Total Daily amount of Acetaminophen  Do not take more than  3,000 mg per day      Day 7    Time   Name of Medication Number of pills taken  Amount of Acetaminophen   Pain Level   Comments  AM PM       AM PM       AM PM       AM PM       AM PM       AM PM       AM PM       AM PM       Total Daily amount of Acetaminophen   Do not take more than  3,000 mg per day        For additional information about how and where to safely dispose of unused opioid medications - [email or web address]  Disclaimer: This document contains information and/or instructional materials adapted from [facility] for the typical patient with your condition. It does not replace medical advice from your health care provider because your experience may differ from that of the typical patient. Talk to your health care provider if you have any questions about this document, your condition or your treatment plan. Adapted from [facility]

## 2024-01-27 NOTE — Interval H&P Note (Signed)
 History and Physical Interval Note:  01/27/2024 8:35 AM  Scott Hailstone Sr.  has presented today for surgery, with the diagnosis of umbilical hernia.  The various methods of treatment have been discussed with the patient and family. After consideration of risks, benefits and other options for treatment, the patient has consented to  Procedure(s) with comments: REPAIR, HERNIA, UMBILICAL, ADULT (N/A) - open umbilical hernia repair with mesh as a surgical intervention.  The patient's history has been reviewed, patient examined, no change in status, stable for surgery.  I have reviewed the patient's chart and labs.  Questions were answered to the patient's satisfaction.     [name]

## 2024-01-30 ENCOUNTER — Encounter (HOSPITAL_COMMUNITY): Payer: Self-pay | Admitting: Surgery
